# Patient Record
Sex: Female | Born: 1945 | Race: White | Hispanic: No | Marital: Married | Smoking: Never smoker
Health system: Southern US, Community
[De-identification: ages and names within clinical notes are randomized; demographics above are authoritative.]

## PROBLEM LIST (undated history)

## (undated) DIAGNOSIS — J45909 Unspecified asthma, uncomplicated: Secondary | ICD-10-CM

## (undated) DIAGNOSIS — K219 Gastro-esophageal reflux disease without esophagitis: Secondary | ICD-10-CM

---

## 2017-03-31 ENCOUNTER — Emergency Department (HOSPITAL_COMMUNITY): Payer: Medicare Other

## 2017-03-31 ENCOUNTER — Inpatient Hospital Stay (HOSPITAL_COMMUNITY): Payer: Medicare Other

## 2017-03-31 ENCOUNTER — Encounter (HOSPITAL_COMMUNITY): Payer: Self-pay

## 2017-03-31 ENCOUNTER — Inpatient Hospital Stay (HOSPITAL_COMMUNITY)
Admission: EM | Admit: 2017-03-31 | Discharge: 2017-04-13 | DRG: 330 | Disposition: A | Discharge status: Home / Self Care | Payer: Medicare Other | Attending: General Surgery | Admitting: General Surgery

## 2017-03-31 DIAGNOSIS — N390 Urinary tract infection, site not specified: Secondary | ICD-10-CM

## 2017-03-31 DIAGNOSIS — Z8249 Family history of ischemic heart disease and other diseases of the circulatory system: Secondary | ICD-10-CM | POA: Diagnosis not present

## 2017-03-31 DIAGNOSIS — R05 Cough: Secondary | ICD-10-CM

## 2017-03-31 DIAGNOSIS — H9190 Unspecified hearing loss, unspecified ear: Secondary | ICD-10-CM | POA: Diagnosis present

## 2017-03-31 DIAGNOSIS — Z9049 Acquired absence of other specified parts of digestive tract: Secondary | ICD-10-CM

## 2017-03-31 DIAGNOSIS — R402362 Coma scale, best motor response, obeys commands, at arrival to emergency department: Secondary | ICD-10-CM | POA: Diagnosis present

## 2017-03-31 DIAGNOSIS — K567 Ileus, unspecified: Secondary | ICD-10-CM | POA: Diagnosis not present

## 2017-03-31 DIAGNOSIS — R402142 Coma scale, eyes open, spontaneous, at arrival to emergency department: Secondary | ICD-10-CM | POA: Diagnosis present

## 2017-03-31 DIAGNOSIS — B961 Klebsiella pneumoniae [K. pneumoniae] as the cause of diseases classified elsewhere: Secondary | ICD-10-CM | POA: Diagnosis present

## 2017-03-31 DIAGNOSIS — R059 Cough, unspecified: Secondary | ICD-10-CM

## 2017-03-31 DIAGNOSIS — Z808 Family history of malignant neoplasm of other organs or systems: Secondary | ICD-10-CM | POA: Diagnosis not present

## 2017-03-31 DIAGNOSIS — D500 Iron deficiency anemia secondary to blood loss (chronic): Secondary | ICD-10-CM | POA: Diagnosis present

## 2017-03-31 DIAGNOSIS — D329 Benign neoplasm of meninges, unspecified: Secondary | ICD-10-CM | POA: Diagnosis present

## 2017-03-31 DIAGNOSIS — R911 Solitary pulmonary nodule: Secondary | ICD-10-CM | POA: Diagnosis present

## 2017-03-31 DIAGNOSIS — I517 Cardiomegaly: Secondary | ICD-10-CM | POA: Diagnosis present

## 2017-03-31 DIAGNOSIS — N183 Chronic kidney disease, stage 3 unspecified: Secondary | ICD-10-CM

## 2017-03-31 DIAGNOSIS — N179 Acute kidney failure, unspecified: Secondary | ICD-10-CM | POA: Diagnosis present

## 2017-03-31 DIAGNOSIS — Z886 Allergy status to analgesic agent status: Secondary | ICD-10-CM | POA: Diagnosis not present

## 2017-03-31 DIAGNOSIS — K7689 Other specified diseases of liver: Secondary | ICD-10-CM | POA: Diagnosis present

## 2017-03-31 DIAGNOSIS — E872 Acidosis, unspecified: Secondary | ICD-10-CM

## 2017-03-31 DIAGNOSIS — K644 Residual hemorrhoidal skin tags: Secondary | ICD-10-CM | POA: Diagnosis present

## 2017-03-31 DIAGNOSIS — R42 Dizziness and giddiness: Secondary | ICD-10-CM | POA: Diagnosis present

## 2017-03-31 DIAGNOSIS — K529 Noninfective gastroenteritis and colitis, unspecified: Secondary | ICD-10-CM | POA: Diagnosis present

## 2017-03-31 DIAGNOSIS — K219 Gastro-esophageal reflux disease without esophagitis: Secondary | ICD-10-CM

## 2017-03-31 DIAGNOSIS — C182 Malignant neoplasm of ascending colon: Principal | ICD-10-CM | POA: Diagnosis present

## 2017-03-31 DIAGNOSIS — R402252 Coma scale, best verbal response, oriented, at arrival to emergency department: Secondary | ICD-10-CM | POA: Diagnosis present

## 2017-03-31 DIAGNOSIS — D649 Anemia, unspecified: Secondary | ICD-10-CM | POA: Diagnosis present

## 2017-03-31 DIAGNOSIS — E042 Nontoxic multinodular goiter: Secondary | ICD-10-CM | POA: Diagnosis present

## 2017-03-31 DIAGNOSIS — E041 Nontoxic single thyroid nodule: Secondary | ICD-10-CM

## 2017-03-31 HISTORY — DX: Gastro-esophageal reflux disease without esophagitis: K21.9

## 2017-03-31 HISTORY — DX: Unspecified asthma, uncomplicated: J45.909

## 2017-03-31 LAB — CBC WITH DIFFERENTIAL/PLATELET
BASOS ABS: 0 10*3/uL (ref 0.0–0.1)
Basophils Relative: 0 %
EOS ABS: 0.2 10*3/uL (ref 0.0–0.7)
Eosinophils Relative: 2 %
HCT: 18.9 % — ABNORMAL LOW (ref 36.0–46.0)
Hemoglobin: 5.8 g/dL — CL (ref 12.0–15.0)
LYMPHS ABS: 1.1 10*3/uL (ref 0.7–4.0)
Lymphocytes Relative: 12 %
MCH: 22.4 pg — ABNORMAL LOW (ref 26.0–34.0)
MCHC: 30.7 g/dL (ref 30.0–36.0)
MCV: 73 fL — ABNORMAL LOW (ref 78.0–100.0)
MONO ABS: 0.4 10*3/uL (ref 0.1–1.0)
MONOS PCT: 4 %
NEUTROS ABS: 7.6 10*3/uL (ref 1.7–7.7)
Neutrophils Relative %: 82 %
PLATELETS: 398 10*3/uL (ref 150–400)
RBC: 2.59 MIL/uL — AB (ref 3.87–5.11)
RDW: 16.1 % — AB (ref 11.5–15.5)
WBC: 9.3 10*3/uL (ref 4.0–10.5)

## 2017-03-31 LAB — PROTIME-INR
INR: 1.04
PROTHROMBIN TIME: 13.5 s (ref 11.4–15.2)

## 2017-03-31 LAB — COMPREHENSIVE METABOLIC PANEL
ALBUMIN: 3.3 g/dL — AB (ref 3.5–5.0)
ALT: 9 U/L — ABNORMAL LOW (ref 14–54)
ANION GAP: 9 (ref 5–15)
AST: 13 U/L — AB (ref 15–41)
Alkaline Phosphatase: 73 U/L (ref 38–126)
BILIRUBIN TOTAL: 0.4 mg/dL (ref 0.3–1.2)
BUN: 31 mg/dL — AB (ref 6–20)
CHLORIDE: 110 mmol/L (ref 101–111)
CO2: 20 mmol/L — ABNORMAL LOW (ref 22–32)
Calcium: 8.9 mg/dL (ref 8.9–10.3)
Creatinine, Ser: 1.88 mg/dL — ABNORMAL HIGH (ref 0.44–1.00)
GFR calc Af Amer: 30 mL/min — ABNORMAL LOW (ref >60)
GFR calc non Af Amer: 26 mL/min — ABNORMAL LOW (ref >60)
GLUCOSE: 181 mg/dL — AB (ref 65–99)
POTASSIUM: 3.8 mmol/L (ref 3.5–5.1)
SODIUM: 139 mmol/L (ref 135–145)
TOTAL PROTEIN: 6.3 g/dL — AB (ref 6.5–8.1)

## 2017-03-31 LAB — URINALYSIS, ROUTINE W REFLEX MICROSCOPIC
BILIRUBIN URINE: NEGATIVE
Glucose, UA: NEGATIVE mg/dL
Ketones, ur: NEGATIVE mg/dL
NITRITE: NEGATIVE
PH: 7 (ref 5.0–8.0)
Protein, ur: NEGATIVE mg/dL
SPECIFIC GRAVITY, URINE: 1.004 — AB (ref 1.005–1.030)

## 2017-03-31 LAB — ABO/RH: ABO/RH(D): O POS

## 2017-03-31 LAB — SODIUM, URINE, RANDOM: SODIUM UR: 32 mmol/L

## 2017-03-31 LAB — MRSA PCR SCREENING: MRSA BY PCR: NEGATIVE

## 2017-03-31 LAB — LIPASE, BLOOD: LIPASE: 29 U/L (ref 11–51)

## 2017-03-31 LAB — PREPARE RBC (CROSSMATCH)

## 2017-03-31 LAB — POC OCCULT BLOOD, ED: Fecal Occult Bld: POSITIVE — AB

## 2017-03-31 MED ORDER — MECLIZINE HCL 25 MG PO TABS
50.0000 mg | ORAL_TABLET | Freq: Once | ORAL | Status: AC
  Administered 2017-03-31: 50 mg via ORAL
  Filled 2017-03-31: qty 2

## 2017-03-31 MED ORDER — DEXTROSE 5 % IV SOLN
1.0000 g | INTRAVENOUS | Status: AC
  Administered 2017-04-01 – 2017-04-05 (×6): 1 g via INTRAVENOUS
  Filled 2017-03-31 (×6): qty 10

## 2017-03-31 MED ORDER — SODIUM CHLORIDE 0.9 % IV SOLN
INTRAVENOUS | Status: DC
  Administered 2017-03-31: 23:00:00 via INTRAVENOUS

## 2017-03-31 MED ORDER — ACETAMINOPHEN 650 MG RE SUPP: 650.0000 mg | Freq: Four times a day (QID) | RECTAL | Status: DC | PRN

## 2017-03-31 MED ORDER — SODIUM CHLORIDE 0.9 % IV SOLN: Freq: Once | INTRAVENOUS | Status: DC

## 2017-03-31 MED ORDER — SODIUM CHLORIDE 0.9 % IV BOLUS (SEPSIS)
1000.0000 mL | Freq: Once | INTRAVENOUS | Status: AC
  Administered 2017-03-31: 1000 mL via INTRAVENOUS

## 2017-03-31 MED ORDER — ACETAMINOPHEN 325 MG PO TABS: 650.0000 mg | ORAL_TABLET | Freq: Four times a day (QID) | ORAL | Status: DC | PRN

## 2017-03-31 NOTE — ED Triage Notes (Signed)
Per EMS, pt from home.  Pt has been having diarrhea starting an hour ago.  Pt was on toilet and felt dizzy.  No LOC.  Hx of same.  Pt had an accident with stool and was taking a shower.  Dry heaving.  Slight abdominal pain.  Vitals: 138/76, hr 88, resp 18, cbg 114,

## 2017-03-31 NOTE — ED Notes (Signed)
Bed: FU07 Expected date:  Expected time:  Means of arrival:  Comments: EMS-vertigo

## 2017-03-31 NOTE — H&P (Addendum)
TRH H&P   Patient Demographics:    Rossi Silvestro, is a 71 y.o. female  MRN: [id number]   DOB - 10/02/1945  Admit Date - 03/31/2017  Outpatient Primary MD for the patient is [facility]  Referring MD/NP/PA: Gerald Stabs Tegeler   Outpatient Specialists:   Patient coming from: home  Chief Complaint  Patient presents with  . Dizziness      HPI:    Charline Hoskinson  is a 71 y.o. female, w anemia, Jerrye Bushy, c/o felt feeling fatigued. And then had n/v tonight, abdominal cramping and had diarrhea.  Pt denies hematemesis or coffee grounds.  Pt notes eating italian soup. Pt denies fever, chills, and is currently abdominal pain free.  Pt denies constipation, brbpr, black stool.  Pt denies any odd food eaten or recent travel or hiking.  + dizziness while on toilet.   Pt presented to ED for evaluation.   In ED.  Wbc 9.3, Hgb 5.8, Plt 398.  Bun/creatinine 31/1.88 Ast 13, Alt 9, Alk phos 73, T. Bili 0.4  ua Wbc 6-30.  CT brain => No acute intracranial abnormality. Specifically, no intracranial hemorrhage or CT evidence of large acute infarct.  Left frontal calvarium 11 mm calcified/ossified projection may represent focal area of hyperostosis frontalis interna or calcified meningioma. No surrounding vasogenic edema or significant mass effect.  Pt will be admitted for dizziness and n/v, abd pain , diarrhea and anemia.    Review of systems:    In addition to the HPI above, No recent antibiotic use Pt states prior colonoscopy negative, no diverticulosis, no polyps.  No hx of GI bleeding.   No Fever-chills, No Headache, No changes with Vision or hearing, No problems swallowing food or Liquids, No Chest pain, Cough or Shortness of Breath, No Blood in stool or Urine, No dysuria, No new skin rashes or bruises, No new joints pains-aches,  No new weakness, tingling, numbness in  any extremity, No recent weight gain or loss, No polyuria, polydypsia or polyphagia, No significant Mental Stressors.  A full 10 point Review of Systems was done, except as stated above, all other Review of Systems were negative.   With Past History of the following :    Past Medical History:  Diagnosis Date  . Asthma   . GERD (gastroesophageal reflux disease)       History reviewed. No pertinent surgical history.    Social History:     Social History  Substance Use Topics  . Smoking status: Never Smoker  . Smokeless tobacco: Never Used  . Alcohol use No     Lives - at home in [place], [place] for 2 years  Mobility - walks by self    Family History :     Family History  Problem Relation Age of Onset  . Brain cancer Mother   . Heart attack Father  Home Medications:   Prior to Admission medications   Not on File     Allergies:     Allergies  Allergen Reactions  . Aspirin      Physical Exam:   Vitals  Blood pressure 132/62, pulse 78, temperature (!) 97.4 F (36.3 C), temperature source Oral, resp. rate 17, SpO2 100 %.   1. General  lying in bed in NAD,   2. Normal affect and insight, Not Suicidal or Homicidal, Awake Alert, Oriented X 3.  3. No F.N deficits, ALL C.Nerves Intact, Strength 5/5 all 4 extremities, Sensation intact all 4 extremities, Plantars down going.  4. Ears and Eyes appear Normal,  PERRLA. Moist Oral Mucosa.  , Pale conjunctiva  5. Supple Neck, No JVD, No cervical lymphadenopathy appriciated, No Carotid Bruits.  6. Symmetrical Chest wall movement, Good air movement bilaterally, CTAB.  7. RRR, No Gallops, Rubs or Murmurs, No Parasternal Heave.  8. Positive Bowel Sounds, Abdomen Soft, No tenderness, No organomegaly appriciated,No rebound -guarding or rigidity.  9.  No Cyanosis, Normal Skin Turgor, No Skin Rash or Bruise.  10. Good muscle tone,  joints appear normal , no effusions, Normal ROM.  11. No Palpable Lymph  Nodes in Neck or Axillae    Data Review:    CBC  Recent Labs Lab 03/31/17 1707  WBC 9.3  HGB 5.8*  HCT 18.9*  PLT 398  MCV 73.0*  MCH 22.4*  MCHC 30.7  RDW 16.1*  LYMPHSABS 1.1  MONOABS 0.4  EOSABS 0.2  BASOSABS 0.0   ------------------------------------------------------------------------------------------------------------------  Chemistries   Recent Labs Lab 03/31/17 1707  NA 139  K 3.8  CL 110  CO2 20*  GLUCOSE 181*  BUN 31*  CREATININE 1.88*  CALCIUM 8.9  AST 13*  ALT 9*  ALKPHOS 73  BILITOT 0.4   ------------------------------------------------------------------------------------------------------------------ CrCl cannot be calculated (Unknown ideal weight.). ------------------------------------------------------------------------------------------------------------------ No results for input(s): TSH, T4TOTAL, T3FREE, THYROIDAB in the last 72 hours.  Invalid input(s): FREET3  Coagulation profile  Recent Labs Lab 03/31/17 1707  INR 1.04   ------------------------------------------------------------------------------------------------------------------- No results for input(s): DDIMER in the last 72 hours. -------------------------------------------------------------------------------------------------------------------  Cardiac Enzymes No results for input(s): CKMB, TROPONINI, MYOGLOBIN in the last 168 hours.  Invalid input(s): CK ------------------------------------------------------------------------------------------------------------------ No results found for: BNP   ---------------------------------------------------------------------------------------------------------------  Urinalysis    Component Value Date/Time   COLORURINE YELLOW 03/31/2017 1900   APPEARANCEUR CLEAR 03/31/2017 1900   LABSPEC 1.004 (L) 03/31/2017 1900   PHURINE 7.0 03/31/2017 1900   GLUCOSEU NEGATIVE 03/31/2017 1900   HGBUR SMALL (A) 03/31/2017 1900    BILIRUBINUR NEGATIVE 03/31/2017 1900   KETONESUR NEGATIVE 03/31/2017 1900   PROTEINUR NEGATIVE 03/31/2017 1900   NITRITE NEGATIVE 03/31/2017 1900   LEUKOCYTESUR MODERATE (A) 03/31/2017 1900    ----------------------------------------------------------------------------------------------------------------   Imaging Results:    Dg Chest 2 View  Result Date: 03/31/2017 CLINICAL DATA:  Acute onset of cough, chest congestion and diarrhea. EXAM: CHEST  2 VIEW COMPARISON:  None. FINDINGS: AP semi-erect and lateral images were obtained. Cardiac silhouette normal in size for AP technique. Thoracic aorta mildly tortuous. Hilar and mediastinal contours otherwise unremarkable. Lungs clear. Bronchovascular markings normal. Pulmonary vascularity normal. No visible pleural effusions. No pneumothorax. Degenerative changes involving the thoracic and upper lumbar spine. IMPRESSION: No acute cardiopulmonary disease. Electronically Signed   By: Evangeline Dakin M.D.   On: 03/31/2017 17:50   Ct Head Wo Contrast  Result Date: 03/31/2017 CLINICAL DATA:  71 year old female felt dizzy on toilet. Diarrhea. Initial encounter. EXAM:  CT HEAD WITHOUT CONTRAST TECHNIQUE: Contiguous axial images were obtained from the base of the skull through the vertex without intravenous contrast. COMPARISON:  None. FINDINGS: Brain: No intracranial hemorrhage or CT evidence of large acute infarct. Left frontal calvarium 11 mm calcified/ossified projection may represent focal area of hyperostosis frontalis interna or calcified meningioma. No surrounding vasogenic edema or significant mass effect. Vascular: Vascular calcifications Skull: No acute abnormality. Sinuses/Orbits: Visualized orbital structures unremarkable. Visualized paranasal sinuses clear. Other: Mastoid air cells and middle ear cavities clear. IMPRESSION: No acute intracranial abnormality. Specifically, no intracranial hemorrhage or CT evidence of large acute infarct. Left  frontal calvarium 11 mm calcified/ossified projection may represent focal area of hyperostosis frontalis interna or calcified meningioma. No surrounding vasogenic edema or significant mass effect. Electronically Signed   By: Genia Del M.D.   On: 03/31/2017 18:05     Assessment & Plan:    Principal Problem:   Anemia    Anemia Check ferritin, iron, tibc, folate, esr, spep, upep, tsh, celiac panel Check FOBT (ordered per ED) Check cbc in am Gi consult appreciated  N/v, diarrhea, abd pain Check lipase zofran Check CT scan abd pelvis Gi pathogen panel C. Diff  Dizziness Tele Trop I q6h x3 Check MRI brain Check carotid ultrasound Check cardiac echo Check EEG  UTI Await urine culture Start rocephin 1gm iv qday  ARF Check urine sodium, urine creatinine, urine eosinophils Hydrate with ns iv Check cmp in am    DVT Prophylaxis SCDs  AM Labs Ordered, also please review Full Orders  Family Communication: Admission, patients condition and plan of care including tests being ordered have been discussed with the patient  who indicate understanding and agree with the plan and Code Status.  Code Status FULL CODE  Likely DC to  home  Condition GUARDED    Consults called: GI by ED  Admission status: inpatient  Time spent in minutes : 45   Jani Gravel M.D on 03/31/2017 at 7:44 PM  Between 7am to 7pm - Pager - [phone] . After 7pm go to [email or web address] - password [email or web address]  [facility] - Office  [phone]

## 2017-03-31 NOTE — ED Provider Notes (Signed)
[facility] DEPT Provider Note   CSN: [id number] Arrival date & time: 03/31/17  1613     History   Chief Complaint Chief Complaint  Patient presents with  . Dizziness    HPI Jasmine Cuevas is a 71 y.o. female.  The history is provided by the patient, medical records and the spouse.  Dizziness  Quality:  Lightheadedness, vertigo and head spinning Severity:  Severe Onset quality:  Gradual Duration:  1 day Timing:  Constant Progression:  Waxing and waning Chronicity:  Recurrent Relieved by:  Nothing Worsened by:  Movement Ineffective treatments:  None tried Associated symptoms: diarrhea, headaches, nausea and vomiting   Associated symptoms: no chest pain, no palpitations, no shortness of breath, no syncope and no weakness   Associated symptoms comment:  Diarrrhea    No past medical history on file.  There are no active problems to display for this patient.   No past surgical history on file.  OB History    No data available       Home Medications    Prior to Admission medications   Not on File    Family History No family history on file.  Social History Social History  Substance Use Topics  . Smoking status: Not on file  . Smokeless tobacco: Not on file  . Alcohol use Not on file     Allergies   Patient has no allergy information on record.   Review of Systems Review of Systems  Constitutional: Positive for fatigue. Negative for chills, diaphoresis and fever.  HENT: Negative for congestion.   Eyes: Positive for photophobia. Negative for visual disturbance.  Respiratory: Negative for cough, chest tightness, shortness of breath, wheezing and stridor.   Cardiovascular: Negative for chest pain, palpitations and syncope.  Gastrointestinal: Positive for diarrhea, nausea and vomiting. Negative for abdominal pain and constipation.  Genitourinary: Negative for dysuria, flank pain and frequency.  Musculoskeletal:  Negative for back pain, neck pain and neck stiffness.  Skin: Negative for rash and wound.  Neurological: Positive for dizziness, light-headedness and headaches. Negative for seizures, weakness and numbness.  Psychiatric/Behavioral: Negative for agitation.  All other systems reviewed and are negative.    Physical Exam Updated Vital Signs BP (!) 116/47   Pulse 82   Temp 97.8 F (36.6 C) (Oral)   Resp 16   SpO2 98%   Physical Exam  Constitutional: She is oriented to person, place, and time. She appears well-developed and well-nourished. No distress.  HENT:  Head: Normocephalic and atraumatic.  Mouth/Throat: Oropharynx is clear and moist. No oropharyngeal exudate.  Eyes: Conjunctivae are normal. Right eye exhibits nystagmus. Left eye exhibits nystagmus.  Nystagmus to the left  Neck: Neck supple.  Cardiovascular: Normal rate and regular rhythm.  Exam reveals no gallop.   No murmur heard. Pulmonary/Chest: Effort normal and breath sounds normal. No respiratory distress. She has no wheezes. She has no rales. She exhibits no tenderness.  Abdominal: Soft. There is no tenderness.  Musculoskeletal: She exhibits no edema or tenderness.  Neurological: She is alert and oriented to person, place, and time. She is not disoriented. No cranial nerve deficit or sensory deficit. She exhibits normal muscle tone. Coordination normal. GCS eye subscore is 4. GCS verbal subscore is 5. GCS motor subscore is 6.  Patient's vertigo was reproducible with head and neck movement including sitting up.  Skin: Skin is warm and dry. No rash noted. She is not diaphoretic. No erythema.  Psychiatric: She has a normal mood  and affect.  Nursing note and vitals reviewed.    ED Treatments / Results  Labs (all labs ordered are listed, but only abnormal results are displayed) Labs Reviewed  CBC WITH DIFFERENTIAL/PLATELET - Abnormal; Notable for the following:       Result Value   RBC 2.59 (*)    Hemoglobin 5.8 (*)      HCT 18.9 (*)    MCV 73.0 (*)    MCH 22.4 (*)    RDW 16.1 (*)    All other components within normal limits  COMPREHENSIVE METABOLIC PANEL - Abnormal; Notable for the following:    CO2 20 (*)    Glucose, Bld 181 (*)    BUN 31 (*)    Creatinine, Ser 1.88 (*)    Total Protein 6.3 (*)    Albumin 3.3 (*)    AST 13 (*)    ALT 9 (*)    GFR calc non Af Amer 26 (*)    GFR calc Af Amer 30 (*)    All other components within normal limits  URINALYSIS, ROUTINE W REFLEX MICROSCOPIC - Abnormal; Notable for the following:    Specific Gravity, Urine 1.004 (*)    Hgb urine dipstick SMALL (*)    Leukocytes, UA MODERATE (*)    Bacteria, UA MANY (*)    Squamous Epithelial / LPF 0-5 (*)    All other components within normal limits  POC OCCULT BLOOD, ED - Abnormal; Notable for the following:    Fecal Occult Bld POSITIVE (*)    All other components within normal limits  MRSA PCR SCREENING  URINE CULTURE  GASTROINTESTINAL PANEL BY PCR, STOOL (REPLACES STOOL CULTURE)  C DIFFICILE QUICK SCREEN W PCR REFLEX  PROTIME-INR  SODIUM, URINE, RANDOM  LIPASE, BLOOD  FERRITIN  IRON AND TIBC  VITAMIN B12  FOLATE RBC  TSH  GLIADIN ANTIBODIES, SERUM  TISSUE TRANSGLUTAMINASE, IGA  RETICULIN ANTIBODIES, IGA W TITER  PROTEIN ELECTROPHORESIS, SERUM  IFE/LIGHT CHAINS/TP QN, 24-HR UR  CALCIUM / CREATININE RATIO, URINE  COMPREHENSIVE METABOLIC PANEL  CBC  TROPONIN I  TROPONIN I  TROPONIN I  TYPE AND SCREEN  PREPARE RBC (CROSSMATCH)  ABO/RH  CYTOLOGY - NON PAP    EKG  EKG Interpretation  Date/Time:  Tuesday March 31 2017 17:05:12 EDT Ventricular Rate:  58 PR Interval:    QRS Duration: 170 QT Interval:  513 QTC Calculation: 504 R Axis:   -12 Text Interpretation:  Sinus rhythm Consider left atrial enlargement Right bundle branch block No prior ECG for comparison.  No STEMI Confirmed by Antony Blackbird [phone]) on 03/31/2017 6:49:19 PM       Radiology Dg Chest 2 View  Result Date:  03/31/2017 CLINICAL DATA:  Acute onset of cough, chest congestion and diarrhea. EXAM: CHEST  2 VIEW COMPARISON:  None. FINDINGS: AP semi-erect and lateral images were obtained. Cardiac silhouette normal in size for AP technique. Thoracic aorta mildly tortuous. Hilar and mediastinal contours otherwise unremarkable. Lungs clear. Bronchovascular markings normal. Pulmonary vascularity normal. No visible pleural effusions. No pneumothorax. Degenerative changes involving the thoracic and upper lumbar spine. IMPRESSION: No acute cardiopulmonary disease. Electronically Signed   By: Evangeline Dakin M.D.   On: 03/31/2017 17:50   Ct Head Wo Contrast  Result Date: 03/31/2017 CLINICAL DATA:  71 year old female felt dizzy on toilet. Diarrhea. Initial encounter. EXAM: CT HEAD WITHOUT CONTRAST TECHNIQUE: Contiguous axial images were obtained from the base of the skull through the vertex without intravenous contrast. COMPARISON:  None. FINDINGS:  Brain: No intracranial hemorrhage or CT evidence of large acute infarct. Left frontal calvarium 11 mm calcified/ossified projection may represent focal area of hyperostosis frontalis interna or calcified meningioma. No surrounding vasogenic edema or significant mass effect. Vascular: Vascular calcifications Skull: No acute abnormality. Sinuses/Orbits: Visualized orbital structures unremarkable. Visualized paranasal sinuses clear. Other: Mastoid air cells and middle ear cavities clear. IMPRESSION: No acute intracranial abnormality. Specifically, no intracranial hemorrhage or CT evidence of large acute infarct. Left frontal calvarium 11 mm calcified/ossified projection may represent focal area of hyperostosis frontalis interna or calcified meningioma. No surrounding vasogenic edema or significant mass effect. Electronically Signed   By: Genia Del M.D.   On: 03/31/2017 18:05    Procedures Procedures (including critical care time)  CRITICAL CARE Performed by: Gwenyth Allegra  Tegeler Total critical care time:  35 minutes Symptomatic anemia requiring transfusion. Critical care time was exclusive of separately billable procedures and treating other patients. Critical care was necessary to treat or prevent imminent or life-threatening deterioration. Critical care was time spent personally by me on the following activities: development of treatment plan with patient and/or surrogate as well as nursing, discussions with consultants, evaluation of patient's response to treatment, examination of patient, obtaining history from patient or surrogate, ordering and performing treatments and interventions, ordering and review of laboratory studies, ordering and review of radiographic studies, pulse oximetry and re-evaluation of patient's condition.   Medications Ordered in ED Medications - No data to display   Initial Impression / Assessment and Plan / ED Course  I have reviewed the triage vital signs and the nursing notes.  Pertinent labs & imaging results that were available during my care of the patient were reviewed by me and considered in my medical decision making (see chart for details).     Jasmine Cuevas is a 71 y.o. female with a past medical history of vertigo who presents with her husband and friend for fatigue, nausea, vomiting, diarrhea, chills, congestion, cough, autophobia, and dizziness.  Patient reports that she began feeling bad yesterday.  She said that she was fatigued had chills and had some nausea.  Her husband reports that she is been having some chest congestion and heaviness.  She denied chest pain.  She says that she was feeling slightly better this morning but went to make soup and then started having nausea.  She went to the bathroom and had multiple episodes of diarrhea and emesis followed by onset of dizzy sensation.  She feels that her head was spinning and any body movement or head movement made it worse.  She says it feels the same as her prior  vertigo.  She denies vision changes or double vision.  She denies severe headache.  She reports that she was unable to get up out of the bathtub so her husband called EMS.  She currently denies any headache, abdominal pain, or chest pain.  She denies traumatic injuries.  She denies neck pain or neck stiffness.  On exam, patient reports she is still feeling dizzy.  It is worsened with movement and is reproducible with body movement.  Patient found to have nystagmus when looking to the left.  Patient has normal sensation in the face arms and legs.  Normal coordination finger-nose-finger.  Normal pupil reaction.  No facial droop.  Normal speech.  Lungs clear and chest nontender.  Patient is alert and oriented.  Suspect patient has dehydration from her nausea vomiting and diarrhea that potentially precipitated a vertigo spell.  Given the positional  worsening of symptoms, doubt stroke at this time.  Patient will have laboratory testing and imaging to look for occult infection.  Patient will have a head CT however.  Patient will be given fluids and meclizine to try to alleviate her symptoms.  Anticipate reassessment after testing.  Diagnostic testing results see above.  Hemoglobin found to be low at 5.8.  Fecal occult test was also positive.  Suspect GI bleed.  Type and screen was sent and patient was given 2 units of blood.  No nitrites seen but there were leukocytes and bacteria with hemoglobin.  Uncertain if patient has UTI given lack of symptoms.  Culture will be sent.  Patient also found to have acute kidney injury.  Patient felt better in regards to  Dizziness and nausea after fluids and meclizine.  However suspect symptomatic anemia contributing to symptoms.  CT of the head showed possible meningioma but otherwise no intracranial intracranial abnormality such as hemorrhage or large infarct.  Patient may need MRI to further characterize.  Chest x-ray shows no pneumonia.  Due to anemia and concern for GI  bleed, gastroenterology was called and will see patient tomorrow.  Patient admitted to hospitalist service for further management.  Patient admitted in stable condition.    Final Clinical Impressions(s) / ED Diagnoses   Final diagnoses:  Vertigo  Symptomatic anemia     Clinical Impression: 1. Vertigo   2. Symptomatic anemia     Disposition: Admit to [facility] service    Tegeler, Gwenyth Allegra, MD 04/01/17 [phone]

## 2017-04-01 ENCOUNTER — Inpatient Hospital Stay (HOSPITAL_COMMUNITY): Payer: Medicare Other

## 2017-04-01 DIAGNOSIS — N390 Urinary tract infection, site not specified: Secondary | ICD-10-CM

## 2017-04-01 DIAGNOSIS — N179 Acute kidney failure, unspecified: Secondary | ICD-10-CM

## 2017-04-01 DIAGNOSIS — R42 Dizziness and giddiness: Secondary | ICD-10-CM

## 2017-04-01 LAB — TYPE AND SCREEN
ABO/RH(D): O POS
ANTIBODY SCREEN: NEGATIVE
Unit division: 0
Unit division: 0

## 2017-04-01 LAB — COMPREHENSIVE METABOLIC PANEL
ALT: 11 U/L — AB (ref 14–54)
AST: 15 U/L (ref 15–41)
Albumin: 3.2 g/dL — ABNORMAL LOW (ref 3.5–5.0)
Alkaline Phosphatase: 70 U/L (ref 38–126)
Anion gap: 8 (ref 5–15)
BUN: 23 mg/dL — ABNORMAL HIGH (ref 6–20)
CHLORIDE: 120 mmol/L — AB (ref 101–111)
CO2: 20 mmol/L — ABNORMAL LOW (ref 22–32)
CREATININE: 1.77 mg/dL — AB (ref 0.44–1.00)
Calcium: 9.3 mg/dL (ref 8.9–10.3)
GFR, EST AFRICAN AMERICAN: 32 mL/min — AB (ref >60)
GFR, EST NON AFRICAN AMERICAN: 28 mL/min — AB (ref >60)
Glucose, Bld: 99 mg/dL (ref 65–99)
POTASSIUM: 3.9 mmol/L (ref 3.5–5.1)
Sodium: 148 mmol/L — ABNORMAL HIGH (ref 135–145)
TOTAL PROTEIN: 6.4 g/dL — AB (ref 6.5–8.1)
Total Bilirubin: 0.4 mg/dL (ref 0.3–1.2)

## 2017-04-01 LAB — VAS US CAROTID
LCCADDIAS: 17 cm/s
LEFT ECA DIAS: -14 cm/s
LEFT VERTEBRAL DIAS: -18 cm/s
LICADDIAS: -27 cm/s
LICADSYS: -92 cm/s
LICAPSYS: -61 cm/s
Left CCA dist sys: 78 cm/s
Left CCA prox dias: 9 cm/s
Left CCA prox sys: 90 cm/s
Left ICA prox dias: -18 cm/s
RIGHT ECA DIAS: -8 cm/s
RIGHT VERTEBRAL DIAS: -13 cm/s
Right CCA prox dias: 17 cm/s
Right CCA prox sys: 108 cm/s
Right cca dist sys: -84 cm/s

## 2017-04-01 LAB — IRON AND TIBC
Iron: 33 ug/dL (ref 28–170)
SATURATION RATIOS: 9 % — AB (ref 10.4–31.8)
TIBC: 375 ug/dL (ref 250–450)
UIBC: 342 ug/dL

## 2017-04-01 LAB — VITAMIN B12: Vitamin B-12: 665 pg/mL (ref 180–914)

## 2017-04-01 LAB — C DIFFICILE QUICK SCREEN W PCR REFLEX
C DIFFICLE (CDIFF) ANTIGEN: NEGATIVE
C Diff interpretation: NOT DETECTED
C Diff toxin: NEGATIVE

## 2017-04-01 LAB — BPAM RBC
BLOOD PRODUCT EXPIRATION DATE: 201811222359
Blood Product Expiration Date: 201811222359
ISSUE DATE / TIME: 201810232001
ISSUE DATE / TIME: 201810232343
UNIT TYPE AND RH: 5100
UNIT TYPE AND RH: 5100

## 2017-04-01 LAB — ECHOCARDIOGRAM COMPLETE: WEIGHTICAEL: 3121.71 [oz_av]

## 2017-04-01 LAB — FERRITIN: FERRITIN: 9 ng/mL — AB (ref 11–307)

## 2017-04-01 LAB — TROPONIN I: Troponin I: <0.03 ng/mL (ref <0.03)

## 2017-04-01 LAB — TSH: TSH: 1.213 u[IU]/mL (ref 0.350–4.500)

## 2017-04-01 MED ORDER — LACTATED RINGERS IV SOLN
INTRAVENOUS | Status: AC
  Administered 2017-04-01 – 2017-04-02 (×3): via INTRAVENOUS

## 2017-04-01 MED ORDER — FERROUS SULFATE 325 (65 FE) MG PO TABS
325.0000 mg | ORAL_TABLET | Freq: Two times a day (BID) | ORAL | Status: DC
  Administered 2017-04-01 – 2017-04-05 (×8): 325 mg via ORAL
  Filled 2017-04-01 (×8): qty 1

## 2017-04-01 MED ORDER — PEG-KCL-NACL-NASULF-NA ASC-C 100 G PO SOLR
0.5000 | Freq: Once | ORAL | Status: AC
  Administered 2017-04-02: 100 g via ORAL

## 2017-04-01 MED ORDER — CHLORHEXIDINE GLUCONATE 0.12 % MT SOLN
15.0000 mL | Freq: Two times a day (BID) | OROMUCOSAL | Status: DC
  Administered 2017-04-01 – 2017-04-06 (×9): 15 mL via OROMUCOSAL
  Filled 2017-04-01 (×9): qty 15

## 2017-04-01 MED ORDER — ORAL CARE MOUTH RINSE
15.0000 mL | Freq: Two times a day (BID) | OROMUCOSAL | Status: DC
  Administered 2017-04-01 – 2017-04-04 (×4): 15 mL via OROMUCOSAL

## 2017-04-01 MED ORDER — IOPAMIDOL (ISOVUE-300) INJECTION 61%: 15.0000 mL | Freq: Once | INTRAVENOUS | Status: AC | PRN

## 2017-04-01 MED ORDER — IOPAMIDOL (ISOVUE-300) INJECTION 61%
INTRAVENOUS | Status: AC
  Filled 2017-04-01: qty 30

## 2017-04-01 MED ORDER — PEG-KCL-NACL-NASULF-NA ASC-C 100 G PO SOLR
0.5000 | Freq: Once | ORAL | Status: AC
  Administered 2017-04-01: 100 g via ORAL
  Filled 2017-04-01: qty 1

## 2017-04-01 MED ORDER — PEG-KCL-NACL-NASULF-NA ASC-C 100 G PO SOLR: 1.0000 | Freq: Once | ORAL | Status: DC

## 2017-04-01 NOTE — Progress Notes (Signed)
*  PRELIMINARY RESULTS* Vascular Ultrasound Carotid Duplex (Doppler) has been completed.  Preliminary findings: Bilateral 1-39% ICA stenosis, antegrade vertebral flow.  Thyroid nodules noted bilaterally.   Everrett Coombe 04/01/2017, 11:51 AM

## 2017-04-01 NOTE — Progress Notes (Signed)
PROGRESS NOTE    Jasmine Cuevas  LHT:[id number] DOB: [birth year] DOA: 03/31/2017 PCP: System, Pcp Not In (Confirm with patient/family/NH records and if not entered, this HAS to be entered at [facility] point of entry. "No PCP" if truly none.)   Brief Narrative: (Start on day 1 of progress note - keep it brief and live) 71 yo F with hx of anemia, GERD presenting with fatigue, nausea, vomiting, vertigo found to have anemia and positive stool hemoccult.  Admitted for anemia, dizziness, N/V.     Assessment & Plan:   Principal Problem:   Anemia Active Problems:   Dizziness   Anemia: microcytic.  5.8 on presentation, improved to 8.7 after transfusion.  Positive hemoccult in ED.   Labs pending including iron panel, ferritin, folate, ESR, SPEP, upep, TSH, celiac panel GI consult pending, plan for endoscopy/colonoscopy tomorrow.  Nausea, vomiting, abdominal pain:  Suspect gastroenteritis as sx have resolved.  CT abdomen pelvis pending.     GI path panel and c diff pending.   Vertigo: notes hx of vertigo in past, sx improved with meclizine.  She has hearing loss, but this is chronic, maybe worse over last week, though vertigo coincided with nausea and vomiting.   Will have work with PT/OT.    MRI notable for meningioma. Echo, carotid dopplers pending. Troponin negative.  EEG pending.  UTI F/u urine cx.  Ceftriaxone.  AKI - unclear baseline, 1.88, improved today to 1.77. Urine sodium, creatinine, urine eos pending Continue IVF  No PCP, placed care management consult for PCP  DVT prophylaxis: SCD Code Status: Full  Family Communication: husband in room Disposition Plan: pending   Consultants:   GI, Dr. Paulita Fujita  Procedures: (Don't include imaging studies which can be auto populated. Include things that cannot be auto populated i.e. Echo, Carotid and venous dopplers, Foley, Bipap, HD, tubes/drains, wound vac, central lines etc)  Echo, carotid dopplers, eeg pending  Antimicrobials:  (specify start and planned stop date. Auto populated tables are space occupying and do not give end dates)  ceftriaxone 10/24 -    Subjective: Sx are better.  Pretty much resolved.  No abdominal pain, nausea, vomiting or diarrhea.  Vertigo improved with meclizine. 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    Objective: Vitals:   04/01/17 0400 04/01/17 0500 04/01/17 0600 04/01/17 0741  BP: (!) 157/67 (!) 142/62 (!) 132/46   Pulse: 72 73 76   Resp: _0 Temp:    97.9 F (36.6 C)  TempSrc:    Oral  SpO2: 97% 94%    Weight:  88.5 kg (195 lb 1.7 oz)      Intake/Output Summary (Last 24 hours) at 04/01/17 0817 Last data filed at 04/01/17 0600  Gross per 24 hour  Intake             2960 ml  Output              775 ml  Net             2185 ml   Filed Weights   04/01/17 0500  Weight: 88.5 kg (195 lb 1.7 oz)    Examination:  General exam: Appears calm and comfortable  Respiratory system: Clear to auscultation. Respiratory effort normal. Cardiovascular system: S1 & S2 heard, RRR. No JVD, murmurs, rubs, gallops or clicks. No pedal edema. Gastrointestinal system: Abdomen is nondistended, soft and nontender. No organomegaly or masses felt. Normal bowel sounds heard. Central nervous system: Alert and oriented. No focal  neurological deficits. Extremities: Symmetric 5 x 5 power. Skin: No rashes, lesions or ulcers Psychiatry: Judgement and insight appear normal. Mood & affect appropriate.     Data Reviewed: I have personally reviewed following labs and imaging studies  CBC:  Recent Labs Lab 03/31/17 1707 04/01/17 0413  WBC 9.3 7.2  NEUTROABS 7.6  --   HGB 5.8* 8.7*  HCT 18.9* 27.0*  MCV 73.0* 78.3  PLT 398 253   Basic Metabolic Panel:  Recent Labs Lab 03/31/17 1707  NA 139  K 3.8  CL 110  CO2 20*  GLUCOSE 181*  BUN 31*  CREATININE 1.88*  CALCIUM 8.9   GFR: CrCl cannot be calculated (Unknown ideal weight.). Liver Function Tests:  Recent Labs Lab  03/31/17 1707  AST 13*  ALT 9*  ALKPHOS 73  BILITOT 0.4  PROT 6.3*  ALBUMIN 3.3*    Recent Labs Lab 03/31/17 2030  LIPASE 29   No results for input(s): AMMONIA in the last 168 hours. Coagulation Profile:  Recent Labs Lab 03/31/17 1707  INR 1.04   Cardiac Enzymes:  Recent Labs Lab 04/01/17 0413  TROPONINI <0.03   BNP (last 3 results) No results for input(s): PROBNP in the last 8760 hours. HbA1C: No results for input(s): HGBA1C in the last 72 hours. CBG: No results for input(s): GLUCAP in the last 168 hours. Lipid Profile: No results for input(s): CHOL, HDL, LDLCALC, TRIG, CHOLHDL, LDLDIRECT in the last 72 hours. Thyroid Function Tests:  Recent Labs  04/01/17 0413  TSH 1.213   Anemia Panel: No results for input(s): VITAMINB12, FOLATE, FERRITIN, TIBC, IRON, RETICCTPCT in the last 72 hours. Sepsis Labs: No results for input(s): PROCALCITON, LATICACIDVEN in the last 168 hours.  Recent Results (from the past 240 hour(s))  MRSA PCR Screening     Status: None   Collection Time: 03/31/17 10:14 PM  Result Value Ref Range Status   MRSA by PCR NEGATIVE NEGATIVE Final    Comment:        The GeneXpert MRSA Assay (FDA approved for NASAL specimens only), is one component of Carmine Carrozza comprehensive MRSA colonization surveillance program. It is not intended to diagnose MRSA infection nor to guide or monitor treatment for MRSA infections.          Radiology Studies: Dg Chest 2 View  Result Date: 03/31/2017 CLINICAL DATA:  Acute onset of cough, chest congestion and diarrhea. EXAM: CHEST  2 VIEW COMPARISON:  None. FINDINGS: AP semi-erect and lateral images were obtained. Cardiac silhouette normal in size for AP technique. Thoracic aorta mildly tortuous. Hilar and mediastinal contours otherwise unremarkable. Lungs clear. Bronchovascular markings normal. Pulmonary vascularity normal. No visible pleural effusions. No pneumothorax. Degenerative changes involving the  thoracic and upper lumbar spine. IMPRESSION: No acute cardiopulmonary disease. Electronically Signed   By: Evangeline Dakin M.D.   On: 03/31/2017 17:50   Ct Head Wo Contrast  Result Date: 03/31/2017 CLINICAL DATA:  71 year old female felt dizzy on toilet. Diarrhea. Initial encounter. EXAM: CT HEAD WITHOUT CONTRAST TECHNIQUE: Contiguous axial images were obtained from the base of the skull through the vertex without intravenous contrast. COMPARISON:  None. FINDINGS: Brain: No intracranial hemorrhage or CT evidence of large acute infarct. Left frontal calvarium 11 mm calcified/ossified projection may represent focal area of hyperostosis frontalis interna or calcified meningioma. No surrounding vasogenic edema or significant mass effect. Vascular: Vascular calcifications Skull: No acute abnormality. Sinuses/Orbits: Visualized orbital structures unremarkable. Visualized paranasal sinuses clear. Other: Mastoid air cells and middle ear cavities clear. IMPRESSION:  No acute intracranial abnormality. Specifically, no intracranial hemorrhage or CT evidence of large acute infarct. Left frontal calvarium 11 mm calcified/ossified projection may represent focal area of hyperostosis frontalis interna or calcified meningioma. No surrounding vasogenic edema or significant mass effect. Electronically Signed   By: Genia Del M.D.   On: 03/31/2017 18:05   Mr Brain Wo Contrast  Result Date: 04/01/2017 CLINICAL DATA:  Vertigo.  Nausea and vomiting. EXAM: MRI HEAD WITHOUT CONTRAST TECHNIQUE: Multiplanar, multiecho pulse sequences of the brain and surrounding structures were obtained without intravenous contrast. COMPARISON:  03/31/2017 head CT FINDINGS: Brain: There is no evidence of acute infarct, intracranial hemorrhage, midline shift, or extra-axial fluid collection. Mild generalized cerebral atrophy is within normal limits for age. No significant cerebral white matter disease is seen. 1 cm extra-axial focus of T2  hypointensity and susceptibility along the inner table of the left frontal skull corresponds to the dense calcification/ossification on CT and may represent focal hyperostosis or meningioma. There is no associated mass effect or brain edema. Vascular: Major intracranial vascular flow voids are preserved. Skull and upper cervical spine: Unremarkable bone marrow signal. Sinuses/Orbits: Unremarkable orbits. Minimal right maxillary sinus mucosal thickening. Clear mastoid air cells. Other: None. IMPRESSION: 1. No acute intracranial abnormality. 2. 1 cm focal ossification or densely calcified meningioma along the left frontal skull. No mass effect or edema. Electronically Signed   By: Logan Bores M.D.   On: 04/01/2017 07:17        Scheduled Meds: . chlorhexidine  15 mL Mouth Rinse BID  . iopamidol      . mouth rinse  15 mL Mouth Rinse q12n4p   Continuous Infusions: . sodium chloride    . sodium chloride 100 mL/hr at 04/01/17 0600  . cefTRIAXone (ROCEPHIN)  IV Stopped (04/01/17 0428)     LOS: 1 day    Time spent: over 30 minutes    Fayrene Helper, MD [facility] Pager [phone]  If 7PM-7AM, please contact night-coverage [email or web address] Password [email or web address] 04/01/2017, 8:17 AM

## 2017-04-01 NOTE — Care Management Note (Signed)
Case Management Note  Patient Details  Name: Jasmine Cuevas MRN: [id number] Date of Birth: [birth year]  Subjective/Objective:                  Anemia and I unit of prbc  Action/Plan: Date:  April 01 2017 Chart reviewed for concurrent status and case management needs.  Will continue to follow patient progress.  Discharge Planning: following for needs  Expected discharge date: April 04, 2017  Velva Harman, BSN, [place], [place]   Expected Discharge Date:   (unknown)               Expected Discharge Plan:  Home/Self Care  In-House Referral:     Discharge planning Services  CM Consult  Post Acute Care Choice:    Choice offered to:     DME Arranged:    DME Agency:     HH Arranged:    HH Agency:     Status of Service:  In process, will continue to follow  If discussed at Long Length of Stay Meetings, dates discussed:    Additional Comments:  Leeroy Cha, RN 04/01/2017, 9:38 AM

## 2017-04-01 NOTE — Consult Note (Signed)
[facility] Consultation Note  Referring Provider: Dr. Jani Gravel Primary Care Physician:  System, Pcp Not In  Reason for Consultation:  anemia  HPI: Jasmine Cuevas is a 71 y.o. female admitted for one-day history of weakness and pre-syncope; found to have Hgb <6.  She has chronic GERD.  No dysphagia, nausea, vomiting, abdominal pain, change in bowel habits, blood in stool (but hemoccult-positive), loss-of-appetite, unintentional weight loss.  No prior egd.  Colonoscopy about 7 years ago in [place], unclear results.   Past Medical History:  Diagnosis Date  . Asthma   . GERD (gastroesophageal reflux disease)     History reviewed. No pertinent surgical history.  Prior to Admission medications   Not on File    Current Facility-Administered Medications  Medication Dose Route Frequency Provider Last Rate Last Dose  . 0.9 %  sodium chloride infusion   Intravenous Once Tegeler, Gwenyth Allegra, MD      . 0.9 %  sodium chloride infusion   Intravenous Continuous Jani Gravel, MD 100 mL/hr at 04/01/17 0600    . acetaminophen (TYLENOL) tablet 650 mg  650 mg Oral Q6H PRN Jani Gravel, MD       Or  . acetaminophen (TYLENOL) suppository 650 mg  650 mg Rectal Q6H PRN Jani Gravel, MD      . cefTRIAXone (ROCEPHIN) 1 g in dextrose 5 % 50 mL IVPB  1 g Intravenous Q24H Jani Gravel, MD   Stopped at 04/01/17 [phone]  . chlorhexidine (PERIDEX) 0.12 % solution 15 mL  15 mL Mouth Rinse BID Jani Gravel, MD      . iopamidol (ISOVUE-300) 61 % injection 15 mL  15 mL Oral Once PRN Elodia Florence., MD      . iopamidol (ISOVUE-300) 61 % injection           . MEDLINE mouth rinse  15 mL Mouth Rinse q12n4p Jani Gravel, MD        Allergies as of 03/31/2017 - Review Complete 03/31/2017  Allergen Reaction Noted  . Aspirin  03/31/2017    Family History  Problem Relation Age of Onset  . Brain cancer Mother   . Heart attack Father     Social History   Social History  . Marital status: Married    Spouse  name: N/A  . Number of children: N/A  . Years of education: N/A   Occupational History  . Not on file.   Social History Main Topics  . Smoking status: Never Smoker  . Smokeless tobacco: Never Used  . Alcohol use No  . Drug use: No  . Sexual activity: Not on file   Other Topics Concern  . Not on file   Social History Narrative  . No narrative on file    Review of Systems: As per HPI, all others negative  Physical Exam: Vital signs in last 24 hours: Temp:  [97.4 F (36.3 C)-98.5 F (36.9 C)] 97.9 F (36.6 C) (10/24 0741) Pulse Rate:  [62-87] 76 (10/24 0600) Resp:  [9-21] 16 (10/24 0600) BP: (109-157)/(45-96) 132/46 (10/24 0600) SpO2:  [83 %-100 %] 94 % (10/24 0500) Weight:  [195 lb 1.7 oz (88.5 kg)] 195 lb 1.7 oz (88.5 kg) (10/24 0500) Last BM Date: 03/31/17 General:   Alert, overweight, pale, Well-developed, well-nourished, pleasant and cooperative in NAD Head:  Normocephalic and atraumatic. Eyes:  Sclera clear, no icterus.   Conjunctiva pale Ears:  Normal auditory acuity. Nose:  No deformity, discharge,  or lesions. Mouth:  No deformity  or lesions.  Oropharynx pale and dry Neck:  Supple; no masses or thyromegaly. Lungs:  Clear throughout to auscultation.   No wheezes, crackles, or rhonchi. No acute distress. Heart:  Regular rate and rhythm; no murmurs, clicks, rubs,  or gallops. Abdomen:  Soft, protuberant, nontender and nondistended. No masses, hepatosplenomegaly or hernias noted. Normal bowel sounds, without guarding, and without rebound.     Msk:  Symmetrical without gross deformities. Normal posture. Pulses:  Normal pulses noted. Extremities:  Without clubbing or edema. Neurologic:  Alert and  oriented x4; diffusely weak, otherwise grossly normal neurologically. Skin:  Occasional ecchymoses, otherwise intact without significant lesions or rashes. Psych:  Alert and cooperative. Normal mood and affect.   Lab Results:  Recent Labs  03/31/17 1707  04/01/17 0413  WBC 9.3 7.2  HGB 5.8* 8.7*  HCT 18.9* 27.0*  PLT 398 369   BMET  Recent Labs  03/31/17 1707  NA 139  K 3.8  CL 110  CO2 20*  GLUCOSE 181*  BUN 31*  CREATININE 1.88*  CALCIUM 8.9   LFT  Recent Labs  03/31/17 1707  PROT 6.3*  ALBUMIN 3.3*  AST 13*  ALT 9*  ALKPHOS 73  BILITOT 0.4   PT/INR  Recent Labs  03/31/17 1707  LABPROT 13.5  INR 1.04    Studies/Results: Dg Chest 2 View  Result Date: 03/31/2017 CLINICAL DATA:  Acute onset of cough, chest congestion and diarrhea. EXAM: CHEST  2 VIEW COMPARISON:  None. FINDINGS: AP semi-erect and lateral images were obtained. Cardiac silhouette normal in size for AP technique. Thoracic aorta mildly tortuous. Hilar and mediastinal contours otherwise unremarkable. Lungs clear. Bronchovascular markings normal. Pulmonary vascularity normal. No visible pleural effusions. No pneumothorax. Degenerative changes involving the thoracic and upper lumbar spine. IMPRESSION: No acute cardiopulmonary disease. Electronically Signed   By: Evangeline Dakin M.D.   On: 03/31/2017 17:50   Ct Head Wo Contrast  Result Date: 03/31/2017 CLINICAL DATA:  71 year old female felt dizzy on toilet. Diarrhea. Initial encounter. EXAM: CT HEAD WITHOUT CONTRAST TECHNIQUE: Contiguous axial images were obtained from the base of the skull through the vertex without intravenous contrast. COMPARISON:  None. FINDINGS: Brain: No intracranial hemorrhage or CT evidence of large acute infarct. Left frontal calvarium 11 mm calcified/ossified projection may represent focal area of hyperostosis frontalis interna or calcified meningioma. No surrounding vasogenic edema or significant mass effect. Vascular: Vascular calcifications Skull: No acute abnormality. Sinuses/Orbits: Visualized orbital structures unremarkable. Visualized paranasal sinuses clear. Other: Mastoid air cells and middle ear cavities clear. IMPRESSION: No acute intracranial abnormality.  Specifically, no intracranial hemorrhage or CT evidence of large acute infarct. Left frontal calvarium 11 mm calcified/ossified projection may represent focal area of hyperostosis frontalis interna or calcified meningioma. No surrounding vasogenic edema or significant mass effect. Electronically Signed   By: Genia Del M.D.   On: 03/31/2017 18:05   Mr Brain Wo Contrast  Result Date: 04/01/2017 CLINICAL DATA:  Vertigo.  Nausea and vomiting. EXAM: MRI HEAD WITHOUT CONTRAST TECHNIQUE: Multiplanar, multiecho pulse sequences of the brain and surrounding structures were obtained without intravenous contrast. COMPARISON:  03/31/2017 head CT FINDINGS: Brain: There is no evidence of acute infarct, intracranial hemorrhage, midline shift, or extra-axial fluid collection. Mild generalized cerebral atrophy is within normal limits for age. No significant cerebral white matter disease is seen. 1 cm extra-axial focus of T2 hypointensity and susceptibility along the inner table of the left frontal skull corresponds to the dense calcification/ossification on CT and may represent focal hyperostosis  or meningioma. There is no associated mass effect or brain edema. Vascular: Major intracranial vascular flow voids are preserved. Skull and upper cervical spine: Unremarkable bone marrow signal. Sinuses/Orbits: Unremarkable orbits. Minimal right maxillary sinus mucosal thickening. Clear mastoid air cells. Other: None. IMPRESSION: 1. No acute intracranial abnormality. 2. 1 cm focal ossification or densely calcified meningioma along the left frontal skull. No mass effect or edema. Electronically Signed   By: Logan Bores M.D.   On: 04/01/2017 07:17   Impression:  1.  Anemia, symptomatic. 2.  Hemoccult-positive stool. 3.  GERD.  Plan:  1.  Endoscopy and colonoscopy tomorrow for further evaluation. 2.  PPI. 3.  IV fluids. 4.  Serial CBCs. 5.  Next step in management pending EGD/COLONOSCOPY results.    LOS: 1 day    Ozias Dicenzo M  04/01/2017, 9:21 AM  Cell [phone] If no answer or after 5 PM call [phone]

## 2017-04-01 NOTE — Progress Notes (Signed)
  Echocardiogram 2D Echocardiogram has been performed.  Darlina Sicilian M 04/01/2017, 2:48 PM

## 2017-04-02 ENCOUNTER — Inpatient Hospital Stay (HOSPITAL_COMMUNITY): Payer: Medicare Other

## 2017-04-02 ENCOUNTER — Inpatient Hospital Stay (HOSPITAL_COMMUNITY): Payer: Medicare Other | Admitting: Certified Registered Nurse Anesthetist

## 2017-04-02 ENCOUNTER — Encounter (HOSPITAL_COMMUNITY)
Admission: EM | Disposition: A | Discharge status: Home / Self Care | Payer: Self-pay | Source: Home / Self Care | Attending: Family Medicine

## 2017-04-02 DIAGNOSIS — R160 Hepatomegaly, not elsewhere classified: Secondary | ICD-10-CM

## 2017-04-02 DIAGNOSIS — D500 Iron deficiency anemia secondary to blood loss (chronic): Secondary | ICD-10-CM

## 2017-04-02 DIAGNOSIS — D509 Iron deficiency anemia, unspecified: Secondary | ICD-10-CM

## 2017-04-02 DIAGNOSIS — E872 Acidosis: Secondary | ICD-10-CM

## 2017-04-02 DIAGNOSIS — R911 Solitary pulmonary nodule: Secondary | ICD-10-CM

## 2017-04-02 DIAGNOSIS — C182 Malignant neoplasm of ascending colon: Secondary | ICD-10-CM

## 2017-04-02 HISTORY — PX: COLONOSCOPY WITH PROPOFOL: SHX5780

## 2017-04-02 HISTORY — PX: ESOPHAGOGASTRODUODENOSCOPY (EGD) WITH PROPOFOL: SHX5813

## 2017-04-02 LAB — UIFE/LIGHT CHAINS/TP QN, 24-HR UR
% BETA, URINE: 0 %
ALPHA 1 URINE: 0 %
ALPHA 2 UR: 0 %
Albumin, U: 0 %
FREE LAMBDA LT CHAINS, UR: 2 mg/L (ref 0.24–6.66)
Free Kappa/Lambda Ratio: 6.6 (ref 2.04–10.37)
Free Lt Chn Excr Rate: 13.2 mg/L (ref 1.35–24.19)
GAMMA GLOBULIN URINE: 0 %
TOTAL PROTEIN, URINE-UPE24: 11.9 mg/dL

## 2017-04-02 LAB — PROTEIN ELECTROPHORESIS, SERUM
A/G RATIO SPE: 1.1 (ref 0.7–1.7)
ALBUMIN ELP: 3 g/dL (ref 2.9–4.4)
Alpha-1-Globulin: 0.2 g/dL (ref 0.0–0.4)
Alpha-2-Globulin: 0.8 g/dL (ref 0.4–1.0)
Beta Globulin: 0.9 g/dL (ref 0.7–1.3)
GAMMA GLOBULIN: 0.9 g/dL (ref 0.4–1.8)
Globulin, Total: 2.8 g/dL (ref 2.2–3.9)
TOTAL PROTEIN ELP: 5.8 g/dL — AB (ref 6.0–8.5)

## 2017-04-02 LAB — GASTROINTESTINAL PANEL BY PCR, STOOL (REPLACES STOOL CULTURE)
ASTROVIRUS: NOT DETECTED
Adenovirus F40/41: NOT DETECTED
CRYPTOSPORIDIUM: NOT DETECTED
CYCLOSPORA CAYETANENSIS: NOT DETECTED
Campylobacter species: NOT DETECTED
ENTEROAGGREGATIVE E COLI (EAEC): NOT DETECTED
ENTEROPATHOGENIC E COLI (EPEC): NOT DETECTED
Entamoeba histolytica: NOT DETECTED
Enterotoxigenic E coli (ETEC): NOT DETECTED
GIARDIA LAMBLIA: NOT DETECTED
Norovirus GI/GII: NOT DETECTED
Plesimonas shigelloides: NOT DETECTED
ROTAVIRUS A: NOT DETECTED
Salmonella species: NOT DETECTED
Sapovirus (I, II, IV, and V): NOT DETECTED
Shiga like toxin producing E coli (STEC): NOT DETECTED
Shigella/Enteroinvasive E coli (EIEC): NOT DETECTED
VIBRIO SPECIES: NOT DETECTED
Vibrio cholerae: NOT DETECTED
YERSINIA ENTEROCOLITICA: NOT DETECTED

## 2017-04-02 LAB — CBC
HCT: 27 % — ABNORMAL LOW (ref 36.0–46.0)
HEMATOCRIT: 27.4 % — AB (ref 36.0–46.0)
Hemoglobin: 8.7 g/dL — ABNORMAL LOW (ref 12.0–15.0)
Hemoglobin: 8.8 g/dL — ABNORMAL LOW (ref 12.0–15.0)
MCH: 25.2 pg — ABNORMAL LOW (ref 26.0–34.0)
MCH: 25.4 pg — AB (ref 26.0–34.0)
MCHC: 32.2 g/dL (ref 30.0–36.0)
MCHC: 32.1 g/dL (ref 30.0–36.0)
MCV: 78.3 fL (ref 78.0–100.0)
MCV: 79 fL (ref 78.0–100.0)
PLATELETS: 369 10*3/uL (ref 150–400)
Platelets: 354 10*3/uL (ref 150–400)
RBC: 3.45 MIL/uL — AB (ref 3.87–5.11)
RBC: 3.47 MIL/uL — ABNORMAL LOW (ref 3.87–5.11)
RDW: 17.8 % — ABNORMAL HIGH (ref 11.5–15.5)
RDW: 17.9 % — ABNORMAL HIGH (ref 11.5–15.5)
WBC: 7.2 10*3/uL (ref 4.0–10.5)
WBC: 6.8 10*3/uL (ref 4.0–10.5)

## 2017-04-02 LAB — GLIADIN ANTIBODIES, SERUM
Antigliadin Abs, IgA: 2 units (ref 0–19)
Gliadin IgG: 2 units (ref 0–19)

## 2017-04-02 LAB — CALCIUM / CREATININE RATIO, URINE
CALCIUM UR: 1.1 mg/dL
CREATININE, UR: 14 mg/dL
Calcium/Creat.Ratio: 79 mg/g creat (ref 0–260)

## 2017-04-02 LAB — FOLATE RBC
FOLATE, RBC: 1582 ng/mL (ref >498)
Folate, Hemolysate: 444.5 ng/mL
Hematocrit: 28.1 % — ABNORMAL LOW (ref 34.0–46.6)

## 2017-04-02 LAB — BASIC METABOLIC PANEL
Anion gap: 10 (ref 5–15)
BUN: 15 mg/dL (ref 6–20)
CALCIUM: 8.9 mg/dL (ref 8.9–10.3)
CO2: 16 mmol/L — AB (ref 22–32)
Chloride: 116 mmol/L — ABNORMAL HIGH (ref 101–111)
Creatinine, Ser: 1.74 mg/dL — ABNORMAL HIGH (ref 0.44–1.00)
GFR calc Af Amer: 33 mL/min — ABNORMAL LOW (ref >60)
GFR calc non Af Amer: 28 mL/min — ABNORMAL LOW (ref >60)
GLUCOSE: 128 mg/dL — AB (ref 65–99)
Potassium: 3.8 mmol/L (ref 3.5–5.1)
Sodium: 142 mmol/L (ref 135–145)

## 2017-04-02 LAB — TISSUE TRANSGLUTAMINASE, IGA: Tissue Transglutaminase Ab, IgA: <2 U/mL (ref 0–3)

## 2017-04-02 LAB — CREATININE, URINE, RANDOM: Creatinine, Urine: 19.96 mg/dL

## 2017-04-02 SURGERY — ESOPHAGOGASTRODUODENOSCOPY (EGD) WITH PROPOFOL
Anesthesia: Monitor Anesthesia Care | Laterality: Left

## 2017-04-02 MED ORDER — PROPOFOL 500 MG/50ML IV EMUL
INTRAVENOUS | Status: DC | PRN
  Administered 2017-04-02: 100 ug/kg/min via INTRAVENOUS

## 2017-04-02 MED ORDER — LIDOCAINE 2% (20 MG/ML) 5 ML SYRINGE
INTRAMUSCULAR | Status: AC
  Filled 2017-04-02: qty 5

## 2017-04-02 MED ORDER — ONDANSETRON HCL 4 MG/2ML IJ SOLN
INTRAMUSCULAR | Status: AC
  Filled 2017-04-02: qty 2

## 2017-04-02 MED ORDER — SODIUM BICARBONATE 650 MG PO TABS
650.0000 mg | ORAL_TABLET | Freq: Three times a day (TID) | ORAL | Status: DC
  Administered 2017-04-02 – 2017-04-04 (×6): 650 mg via ORAL
  Filled 2017-04-02 (×6): qty 1

## 2017-04-02 MED ORDER — LACTATED RINGERS IV SOLN
INTRAVENOUS | Status: AC
  Administered 2017-04-02 – 2017-04-03 (×2): via INTRAVENOUS

## 2017-04-02 MED ORDER — LIDOCAINE 2% (20 MG/ML) 5 ML SYRINGE
INTRAMUSCULAR | Status: DC | PRN
  Administered 2017-04-02: 50 mg via INTRAVENOUS

## 2017-04-02 MED ORDER — PROPOFOL 10 MG/ML IV BOLUS
INTRAVENOUS | Status: AC
  Filled 2017-04-02: qty 60

## 2017-04-02 MED ORDER — EPHEDRINE SULFATE-NACL 50-0.9 MG/10ML-% IV SOSY
PREFILLED_SYRINGE | INTRAVENOUS | Status: DC | PRN
  Administered 2017-04-02: 900 mg via INTRAVENOUS

## 2017-04-02 MED ORDER — PROPOFOL 10 MG/ML IV BOLUS
INTRAVENOUS | Status: DC | PRN
  Administered 2017-04-02: 20 mg via INTRAVENOUS
  Administered 2017-04-02 (×2): 30 mg via INTRAVENOUS
  Administered 2017-04-02 (×3): 20 mg via INTRAVENOUS

## 2017-04-02 MED ORDER — ONDANSETRON HCL 4 MG/2ML IJ SOLN
INTRAMUSCULAR | Status: DC | PRN
  Administered 2017-04-02: 4 mg via INTRAVENOUS

## 2017-04-02 SURGICAL SUPPLY — 24 items

## 2017-04-02 NOTE — Anesthesia Preprocedure Evaluation (Addendum)
Anesthesia Evaluation  Patient identified by MRN, date of birth, ID band Patient awake    Reviewed: Allergy & Precautions, NPO status , Patient's Chart, lab work & pertinent test results  Airway Mallampati: II  TM Distance: >3 FB Neck ROM: Full    Dental no notable dental hx.    Pulmonary asthma ,    Pulmonary exam normal breath sounds clear to auscultation       Cardiovascular negative cardio ROS Normal cardiovascular exam Rhythm:Regular Rate:Normal     Neuro/Psych negative neurological ROS  negative psych ROS   GI/Hepatic Neg liver ROS, GERD  ,  Endo/Other  negative endocrine ROS  Renal/GU negative Renal ROS     Musculoskeletal negative musculoskeletal ROS (+)   Abdominal   Peds  Hematology  (+) Blood dyscrasia, anemia ,   Anesthesia Other Findings   Reproductive/Obstetrics negative OB ROS                             Anesthesia Physical Anesthesia Plan  ASA: II  Anesthesia Plan: MAC   Post-op Pain Management:    Induction:   PONV Risk Score and Plan: 2 and Ondansetron, Propofol infusion and Treatment may vary due to age or medical condition  Airway Management Planned:   Additional Equipment:   Intra-op Plan:   Post-operative Plan:   Informed Consent: I have reviewed the patients History and Physical, chart, labs and discussed the procedure including the risks, benefits and alternatives for the proposed anesthesia with the patient or authorized representative who has indicated his/her understanding and acceptance.     Plan Discussed with:   Anesthesia Plan Comments:         Anesthesia Quick Evaluation

## 2017-04-02 NOTE — Consult Note (Signed)
New Hematology/Oncology Consult   Referral MD: Dr. Florene Glen  Reason for Referral: Colon cancer  HPI: Ms. Jasmine Cuevas is a 71 year old woman with no significant past medical history who presented to the emergency department 03/31/2017 following acute onset abdominal pain, vomiting and diarrhea. She also had dizziness. Labs on arrival to the emergency department showed a hemoglobin of 5.8, MCV 73; stool positive for occult blood. Brain CT showed no acute intracranial abnormality. Brain MRI also showed no acute abnormality. 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 CT abdomen/pelvis without contrast showed a suspicious mass involving the proximal ascending colon at the level of the ileocecal valve. A large, complex multiloculated cystic mass was noted that appeared to arise from the undersurface of the left lobe of the liver. A 3 mm right lower lobe lung nodule was also noted. She underwent a colonoscopy earlier today with findings of a fungating, infiltrative, polypoid and ulcerated large mass in the proximal ascending colon with partial involvement of the cecum. The mass was partially circumferential. No bleeding was present. Biopsies were obtained with pathology pending. She also underwent an upper endoscopy which showed granular mucosa in the prepyloric region of the stomach which was biopsied.    Past Medical History:  Diagnosis Date  . Asthma   . GERD (gastroesophageal reflux disease)   :  History reviewed. No pertinent surgical history.:   Current Facility-Administered Medications:  .  0.9 %  sodium chloride infusion, , Intravenous, Once, Tegeler, Gwenyth Allegra, MD .  acetaminophen (TYLENOL) tablet 650 mg, 650 mg, Oral, Q6H PRN **OR** acetaminophen (TYLENOL) suppository 650 mg, 650 mg, Rectal, Q6H PRN, Jani Gravel, MD .  cefTRIAXone (ROCEPHIN) 1 g in dextrose 5 % 50 mL IVPB, 1 g, Intravenous, Q24H, Jani Gravel, MD, Stopped at 04/01/17 2029 .   chlorhexidine (PERIDEX) 0.12 % solution 15 mL, 15 mL, Mouth Rinse, BID, Jani Gravel, MD, 15 mL at 04/01/17 1000 .  ferrous sulfate tablet 325 mg, 325 mg, Oral, BID WC, Elodia Florence., MD, 325 mg at 04/01/17 1753 .  lactated ringers infusion, , Intravenous, Continuous, Elodia Florence., MD, Stopped at 04/02/17 1341 .  MEDLINE mouth rinse, 15 mL, Mouth Rinse, q12n4p, Jani Gravel, MD, 15 mL at 04/01/17 1200 .  sodium bicarbonate tablet 650 mg, 650 mg, Oral, TID, Elodia Florence., MD:  . chlorhexidine  15 mL Mouth Rinse BID  . ferrous sulfate  325 mg Oral BID WC  . mouth rinse  15 mL Mouth Rinse q12n4p  . sodium bicarbonate  650 mg Oral TID  :  Allergies  Allergen Reactions  . Aspirin     Eye swelling   :  FH: Maternal aunt with colon cancer. Mother with "brain cancer".   SOCIAL HISTORY: She is originally from [place]. She now lives in [place] with her husband. She has 2 children. She is retired. She was previously employed as a Research scientist (physical sciences) at [employer]. No alcohol or tobacco use. Prior to the current hospitalization she has never had a blood transfusion. No risk factors for HIV or hepatitis.  Review of Systems: She reports acute onset of abdominal pain, vomiting and diarrhea on the day of hospital admission. She was also experiencing dizziness. Prior to that she was feeling well. She has a good appetite. No weight loss. She denies fever and sweats. No shortness of breath. 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 No change in bowel  habits. No bloody or black stools. No urinary symptoms. No rash. She has never had a blood clot. No focal extremity weakness.  Physical Exam:  Blood pressure (!) 147/74, pulse 79, temperature 98.4 F (36.9 C), temperature source Oral, resp. rate 18, height 5\' 5"  (1.651 m), weight 185 lb 10 oz (84.2 kg), SpO2 100 %.  HEENT: no thrush or ulcers. Lungs: lungs clear bilaterally. Cardiac: regular rate and  rhythm. Abdomen: abdomen soft and nontender. No hepatomegaly. No mass. Vascular: no leg edema. Lymph nodes: no palpable cervical, supraclavicular, axillary or inguinal lymph nodes. Neurologic: alert and oriented. Motor strength intact. Skin: no rash.  LABS:   Recent Labs  04/01/17 0413 04/02/17 0346  WBC 7.2 6.8  HGB 8.7* 8.8*  HCT 27.0*  28.1* 27.4*  PLT 369 354     Recent Labs  04/01/17 0856 04/02/17 0346  NA 148* 142  K 3.9 3.8  CL 120* 116*  CO2 20* 16*  GLUCOSE 99 128*  BUN 23* 15  CREATININE 1.77* 1.74*  CALCIUM 9.3 8.9      RADIOLOGY:  Ct Abdomen Pelvis Wo Contrast  Result Date: 04/01/2017 CLINICAL DATA:  Abdominal pain.  Nausea and vomiting EXAM: CT ABDOMEN AND PELVIS WITHOUT CONTRAST TECHNIQUE: Multidetector CT imaging of the abdomen and pelvis was performed following the standard protocol without IV contrast. COMPARISON:  None FINDINGS: Lower chest: 3 mm right lower lobe lung nodule is identified, image number 19 of series 4. No acute abnormality identified. Hepatobiliary: There is a large complex lobulated multi septated cystic mass which appears to arise from the undersurface of the left lobe of liver. This measures 9.5 by 12.0 by 12.5 cm. There may be a vascular pedicle communicating between this mass and the area around the falciform ligament. This is incompletely characterized without IV contrast. No focal abnormalities noted within the right lobe of liver. The gallbladder appears normal. Mild increase caliber of the common bile duct which measures 1.2 cm. Pancreas: Unremarkable. No pancreatic ductal dilatation or surrounding inflammatory changes. Spleen: Normal in size without focal abnormality. Adrenals/Urinary Tract: The adrenal glands are normal. Unremarkable appearance of the kidneys. No mass or hydronephrosis. Urinary bladder appears normal. Stomach/Bowel: The stomach is normal. The small bowel loops have a normal course caliber. There is a mass  involving the proximal ascending colon at the level of the ileocecal valve. This measures 3.8 x 4.0 by 3.6 cm, image 59 of series 2 and image 25 of series 5. No obstruction. Enteric contrast material is noted within the colon distal to this mass. Vascular/Lymphatic: Mild aortic atherosclerosis. No aneurysm. No adenopathy within the small bowel mesenteric. No pelvic or inguinal adenopathy. Reproductive: Calcified uterine fibroids noted.  No adnexal mass. Other: No free fluid or fluid collections identified within the abdomen or pelvis. Musculoskeletal: No aggressive lytic or sclerotic bone lesions. IMPRESSION: 1. There is a suspicious mass involving the proximal ascending colon at the level of the ileocecal valve. This is worrisome for a nonobstructing colonic neoplasm. Correlation with direct visualization is advised. 2. There is a large, complex multi loculated cystic mass which appears to arise from the undersurface of the left lobe of liver. 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 Further investigation with contrast enhanced liver protocol MRI is advised. 3. 3 mm right lower lobe lung nodule is identified. No follow-up needed if patient is low-risk. Non-contrast chest CT can be considered in 12 months if patient  is high-risk. This recommendation follows the consensus statement: Guidelines for Management of Incidental Pulmonary Nodules Detected on CT Images: From the Fleischner Society 2017; Radiology 2017; 284:228-243. Electronically Signed   By: Kerby Moors M.D.   On: 04/01/2017 16:50   Dg Chest 2 View  Result Date: 03/31/2017 CLINICAL DATA:  Acute onset of cough, chest congestion and diarrhea. EXAM: CHEST  2 VIEW COMPARISON:  None. FINDINGS: AP semi-erect and lateral images were obtained. Cardiac silhouette normal in size for AP technique. Thoracic aorta mildly tortuous.  Hilar and mediastinal contours otherwise unremarkable. Lungs clear. Bronchovascular markings normal. Pulmonary vascularity normal. No visible pleural effusions. No pneumothorax. Degenerative changes involving the thoracic and upper lumbar spine. IMPRESSION: No acute cardiopulmonary disease. Electronically Signed   By: Evangeline Dakin M.D.   On: 03/31/2017 17:50   Ct Head Wo Contrast  Result Date: 03/31/2017 CLINICAL DATA:  71 year old female felt dizzy on toilet. Diarrhea. Initial encounter. EXAM: CT HEAD WITHOUT CONTRAST TECHNIQUE: Contiguous axial images were obtained from the base of the skull through the vertex without intravenous contrast. COMPARISON:  None. FINDINGS: Brain: No intracranial hemorrhage or CT evidence of large acute infarct. Left frontal calvarium 11 mm calcified/ossified projection may represent focal area of hyperostosis frontalis interna or calcified meningioma. No surrounding vasogenic edema or significant mass effect. Vascular: Vascular calcifications Skull: No acute abnormality. Sinuses/Orbits: Visualized orbital structures unremarkable. Visualized paranasal sinuses clear. Other: Mastoid air cells and middle ear cavities clear. IMPRESSION: No acute intracranial abnormality. Specifically, no intracranial hemorrhage or CT evidence of large acute infarct. Left frontal calvarium 11 mm calcified/ossified projection may represent focal area of hyperostosis frontalis interna or calcified meningioma. No surrounding vasogenic edema or significant mass effect. Electronically Signed   By: Genia Del M.D.   On: 03/31/2017 18:05   Mr Brain Wo Contrast  Result Date: 04/01/2017 CLINICAL DATA:  Vertigo.  Nausea and vomiting. EXAM: MRI HEAD WITHOUT CONTRAST TECHNIQUE: Multiplanar, multiecho pulse sequences of the brain and surrounding structures were obtained without intravenous contrast. COMPARISON:  03/31/2017 head CT FINDINGS: Brain: There is no evidence of acute infarct, intracranial  hemorrhage, midline shift, or extra-axial fluid collection. Mild generalized cerebral atrophy is within normal limits for age. No significant cerebral white matter disease is seen. 1 cm extra-axial focus of T2 hypointensity and susceptibility along the inner table of the left frontal skull corresponds to the dense calcification/ossification on CT and may represent focal hyperostosis or meningioma. There is no associated mass effect or brain edema. Vascular: Major intracranial vascular flow voids are preserved. Skull and upper cervical spine: Unremarkable bone marrow signal. Sinuses/Orbits: Unremarkable orbits. Minimal right maxillary sinus mucosal thickening. Clear mastoid air cells. Other: None. IMPRESSION: 1. No acute intracranial abnormality. 2. 1 cm focal ossification or densely calcified meningioma along the left frontal skull. No mass effect or edema. Electronically Signed   By: Logan Bores M.D.   On: 04/01/2017 07:17    Assessment and Plan:   1. Colon cancer, ascending colon  CT abdomen/pelvis10/24/2018-large complex lobulated multiseptated cystic mass arising from the undersurface of the left lobe of the liver measuring 9.5 x 12 x 12.5 cm. Mass involving the proximal ascending colon at the level of the ileocecal valve measuring 3.8 x 4.0 x 3.6 cm. No obstruction. 3 mm right lower lobe lung nodule.  Colonoscopy 04/02/2017-fungating, infiltrative, polypoid and ulcerated large mass in the proximal ascending colon with partial involvement of the cecum. Biopsy result pending. 2. Anemia secondary to GI blood loss and iron deficiency 3.  Large cystic liver mass 4. Upper endoscopy 04/02/2017-granular mucosa and the prepyloric region of the stomach status post biopsy  Ms. Dicocco appears to have a right side colon cancer. She also has a large liver mass of uncertain etiology, not clearly related to the colon cancer.    Recommendations: 1. Surgery consult to consider resection of the colon mass,  evaluation of liver mass 2. Check CEA 3. Continue oral iron 4. Outpatient oncology follow-up  Ned Card, NP 04/02/2017, 3:56 PM   Ms. Hartis was interviewed and examined. CT images were reviewed.  She was admitted with severe symptomatic area deficiency anemia. A colonoscopy confirmed an ascending colon mass.  She appears to have colon cancer. The large cystic liver mass could represent an isolated metastasis, but is more likely secondary to a different process.  I recommend surgical evaluation to consider resection of the primary tumor and evaluation of the liver mass.  Outpatient follow-up will be scheduled at the [facility].  Julieanne Manson, M.D.

## 2017-04-02 NOTE — Brief Op Note (Addendum)
03/31/2017 - 04/02/2017  1:25 PM  PATIENT:  Jasmine Cuevas  71 y.o. female  PRE-OPERATIVE DIAGNOSIS:  anemia  POST-OPERATIVE DIAGNOSIS:  gastritis external hemorrhoids ascending mass   PROCEDURE:  Procedure(s): ESOPHAGOGASTRODUODENOSCOPY (EGD) WITH PROPOFOL (Left) COLONOSCOPY WITH PROPOFOL (Left)  SURGEON:  Surgeon(s) and Role:    * Elaya Droege, MD - Primary  Findings /recommendations ------------------------------------ - Colonoscopy showed large ulcerated and friable proximal ascending colon mass. Likely malignant. Multiple biopsies taken. - Recommend oncology consult. Patient has a large liver lesion which could be metastatic. - May need surgical evaluation prior to discharge depending oncology recommendation. - Clear liquid diet today. GI will follow - Case discussed with Dr. Benay Spice  over the phone. Possibility of colon cancer also discussed with patient's husband over the phone.   Otis Brace MD, [place] 04/02/2017, 1:26 PM  Pager [phone]  If no answer or after 5 PM call [phone]

## 2017-04-02 NOTE — Op Note (Signed)
[facility] Patient Name: Jasmine Cuevas Procedure Date: 04/02/2017 MRN: [id number] Attending MD: Otis Brace , MD Date of Birth: Dec 25, 1945 CSN: [id number] Age: 71 Admit Type: Outpatient Procedure:                Upper GI endoscopy Indications:              Suspected upper gastrointestinal bleeding in                            patient with unexplained iron deficiency anemia Providers:                Carmie End, RN, Alan Mulder, Technician,                            Otis Brace, MD Referring MD:              Medicines:                Sedation Administered by an Anesthesia Professional Complications:            No immediate complications. Estimated Blood Loss:     Estimated blood loss was minimal. Procedure:                Pre-Anesthesia Assessment:                           - Prior to the procedure, a History and Physical                            was performed, and patient medications and                            allergies were reviewed. The patient's tolerance of                            previous anesthesia was also reviewed. The risks                            and benefits of the procedure and the sedation                            options and risks were discussed with the patient.                            All questions were answered, and informed consent                            was obtained. Prior Anticoagulants: The patient has                            taken no previous anticoagulant or antiplatelet                            agents. ASA Grade Assessment: II - A patient with  mild systemic disease. After reviewing the risks                            and benefits, the patient was deemed in                            satisfactory condition to undergo the procedure.                           After obtaining informed consent, the endoscope was                            passed under direct vision. Throughout  the                            procedure, the patient's blood pressure, pulse, and                            oxygen saturations were monitored continuously. The                            [id number] ([id number]) scope was introduced through the                            mouth, and advanced to the second part of duodenum.                            The patient tolerated the procedure well. The upper                            GI endoscopy was performed with moderate difficulty                            due to the patient's discomfort during the                            procedure. Successful completion of the procedure                            was aided by increasing the dose of sedation                            medication. Scope In: Scope Out: Findings:      Localized moderate mucosal changes characterized by granularity were       found in the prepyloric region of the stomach. Biopsies were taken with       a cold forceps for histology.      The cardia and gastric fundus were normal on retroflexion.      The duodenal bulb, first portion of the duodenum and second portion of       the duodenum were normal.      A non-obstructing Schatzki ring (acquired) was found in the lower third       of the esophagus.      The exam of the esophagus  was otherwise normal. Impression:               - Granular mucosa in the prepyloric region of the                            stomach. Biopsied.                           - Normal duodenal bulb, first portion of the                            duodenum and second portion of the duodenum.                           - Non-obstructing Schatzki ring. Moderate Sedation:      Moderate (conscious) sedation was personally administered by an       anesthesia professional. The following parameters were monitored: oxygen       saturation, heart rate, blood pressure, and response to care. Recommendation:           - Perform a colonoscopy today.                            - Resume previous diet.                           - Continue present medications. Procedure Code(s):        --- Professional ---                           [phone], Esophagogastroduodenoscopy, flexible,                            transoral; with biopsy, single or multiple Diagnosis Code(s):        --- Professional ---                           K31.89, Other diseases of stomach and duodenum                           D50.9, Iron deficiency anemia, unspecified CPT copyright 2016 American Medical Association. All rights reserved. The codes documented in this report are preliminary and upon coder review may  be revised to meet current compliance requirements. Otis Brace, MD Otis Brace, MD 04/02/2017 1:17:02 PM Number of Addenda: 0

## 2017-04-02 NOTE — Progress Notes (Addendum)
Jasmine Cuevas is a 71 y.o. female has presented with symptomatic anemia and occult blood positive stool  The various methods of treatment have been discussed with the patient and family. After consideration of risks, benefits and other options for treatment, the patient has consented to  Procedure(s): EGD and colonoscopy as a surgical intervention .  The patient's history has been reviewed, patient examined, no change in status, stable for surgery.  I have reviewed the patient's chart and labs.  Questions were answered to the patient's satisfaction.   CT scan results reviewed. Possible mass at the proximal ascending colon near ileocecal wall. Also large cystic lesion in the liver concerning for cystadenoma/adenocarcinoma.

## 2017-04-02 NOTE — Progress Notes (Signed)
Pt gone for procedure at this this time. Will check back on pt next day.  [place], [place]

## 2017-04-02 NOTE — Progress Notes (Signed)
PT Cancellation Note  Patient Details Name: Jasmine Cuevas MRN: [id number] DOB: 1945/06/16   Cancelled Treatment:    Reason Eval/Treat Not Completed: Patient at procedure or test/unavailable--will check back on tomorrow.    Weston Anna, MPT Pager: [phone]

## 2017-04-02 NOTE — Anesthesia Procedure Notes (Signed)
Date/Time: 04/02/2017 12:30 PM Performed by: Claudia Desanctis Oxygen Delivery Method: Nasal cannula

## 2017-04-02 NOTE — Op Note (Signed)
[facility] Patient Name: Jasmine Cuevas Procedure Date: 04/02/2017 MRN: [id number] Attending MD: Otis Brace , MD Date of Birth: 04/18/1946 CSN: [id number] Age: 71 Admit Type: Outpatient Procedure:                Colonoscopy Indications:              Iron deficiency anemia, Abnormal CT of the GI tract Providers:                Carmie End, RN, Alan Mulder, Technician,                            Otis Brace, MD Referring MD:              Medicines:                Sedation Administered by an Anesthesia Professional Complications:            No immediate complications. Estimated Blood Loss:     Estimated blood loss was minimal. Procedure:                Pre-Anesthesia Assessment:                           - Prior to the procedure, a History and Physical                            was performed, and patient medications and                            allergies were reviewed. The patient's tolerance of                            previous anesthesia was also reviewed. The risks                            and benefits of the procedure and the sedation                            options and risks were discussed with the patient.                            All questions were answered, and informed consent                            was obtained. Prior Anticoagulants: The patient has                            taken no previous anticoagulant or antiplatelet                            agents. ASA Grade Assessment: II - A patient with                            mild systemic disease. After reviewing the risks  and benefits, the patient was deemed in                            satisfactory condition to undergo the procedure.                           After obtaining informed consent, the colonoscope                            was passed under direct vision. Throughout the                            procedure, the patient's blood pressure,  pulse, and                            oxygen saturations were monitored continuously. The                            EC-[id number] ([id number]) scope was introduced through                            the anus and advanced to the the cecum, identified                            by appendiceal orifice and ileocecal valve. The                            colonoscopy was technically difficult and complex                            due to significant looping and a tortuous colon.                            Successful completion of the procedure was aided by                            changing the patient to a supine position and                            applying abdominal pressure. The patient tolerated                            the procedure well. The quality of the bowel                            preparation was good. The ileocecal valve and the                            rectum were photographed. The quality of the bowel                            preparation was good. Scope In: 12:36:27 PM Scope Out: 1:00:17 PM Scope Withdrawal Time: 0 hours 3 minutes 14 seconds  Total Procedure Duration: 0 hours 23 minutes 50 seconds  Findings:      The perianal exam findings include non-thrombosed external hemorrhoids.      The colon (entire examined portion) revealed moderately excessive       looping. Advancing the scope required changing the patient to a supine       position as well as abdominal pressure. A few small polyps might have       been missed because of the significantly difficult procedure      A fungating, infiltrative, polypoid and ulcerated large mass was found       in the proximal ascending colon with partial involvement of the cecum.       The mass was partially circumferential (involving one-half of the lumen       circumference). No bleeding was present. Biopsies were taken with a cold       forceps for histology.      Internal hemorrhoids were found during retroflexion. Retroflexion  was       difficult as patient was not able to retain air. The hemorrhoids were       small. Impression:               - Non-thrombosed external hemorrhoids found on                            perianal exam.                           - There was significant looping of the colon.                           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                            Biopsied.                           - Internal hemorrhoids. Moderate Sedation:      Moderate (conscious) sedation was personally administered by an       anesthesia professional. The following parameters were monitored: oxygen       saturation, heart rate, blood pressure, and response to care. Recommendation:           - Return patient to hospital ward for ongoing care.                           - Clear liquid diet.                           - Continue present medications.                           - Await pathology results.                           - Repeat colonoscopy in 1 year for surveillance.                           - Refer to a surgeon today. Procedure Code(s):        ---  Professional ---                           [phone], Colonoscopy, flexible; with biopsy, single                            or multiple Diagnosis Code(s):        --- Professional ---                           K64.4, Residual hemorrhoidal skin tags                           K64.8, Other hemorrhoids                           C18.2, Malignant neoplasm of ascending colon                           D50.9, Iron deficiency anemia, unspecified                           R93.3, Abnormal findings on diagnostic imaging of                            other parts of digestive tract CPT copyright 2016 [employer]. All rights reserved. The codes documented in this report are preliminary and upon coder review may  be revised to meet current compliance requirements. Otis Brace, MD Otis Brace, MD 04/02/2017 1:24:36 PM Number  of Addenda: 0

## 2017-04-02 NOTE — Consult Note (Signed)
Reason for Consult: Ascending colon mass  Referring Physician: Dr. Janyce Llanos Chief complaint: Lightheaded numbness, vertigo and head spinning. Primary CARE:System, Pcp Not In Jasmine Cuevas is an 70 y.o. female.    HPI: Patient is a 71 year old female who presented to the emergency department yesterday complaining of having diarrhea 1 hour ago on the toilet felt dizzy had stool incontinence in the shower.  Ongoing dry heaves.  Mild abdominal pain.  Patient denied hematemesis or coffee-ground emesis.  No history of constipation bright red blood in her stool or black stools.  She was in good health before Tuesday when this all occurred.  Apparently never had a sick day in her life.  Workup in the ED shows she is afebrile her vital signs are stable.  Creatinine is 1.88, glucose 181, LFTs are normal lipase is 29.  WBC was 9.3 hemoglobin 5.8 hematocrit 18.9 platelets 398,000.  TSH 1.213.  Urinalysis was normal except for many bacteria and 6-30 white cells per high-powered field.  Fecal occult was positive for blood.  CT of the head Showed no acute intracranial abnormality.  MRI of the brain showed no acute infarct intracranial hemorrhage midline shift or extra-axial fluid collection.  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  CT of the abdomen showed a suspicious mass involving the ascending colon at the level of the ileocecal valve, worrisome for a nonobstructing colonic neoplasm.  There was a large complex multiloculated cystic mass that arises from the undersurface of the left lobe of the liver measures 9.5 x 12 x 12.5 cm. Primary differential included that of biliary cysts adenoma or adenocarcinoma.  3 mm right lower lobe nodule identified.  She was admitted by medicine, and seen by gastroenterology Dr. Arta Silence.  She was transfused and then scheduled for endoscopy and colonoscopy today.  She is found to have  non-thrombosed hemorrhoids on perianal exam.  There is a significant looping of the colon.  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  Endoscopy was normal with some granular mucosa in the pre-pyloric region of the stomach, some gastritis.  We are asked to see. . sodium chloride    . cefTRIAXone (ROCEPHIN)  IV Stopped (04/01/17 2029)  . lactated ringers Stopped (04/02/17 1341)   Anti-infectives    Start     Dose/Rate Route Frequency Ordered Stop   03/31/17 2000  cefTRIAXone (ROCEPHIN) 1 g in dextrose 5 % 50 mL IVPB     1 g 100 mL/hr over 30 Minutes Intravenous Every 24 hours 03/31/17 1957        Past Medical History:  Diagnosis Date  . Asthma   . GERD (gastroesophageal reflux disease)     History reviewed. No pertinent surgical history.  Family History  Problem Relation Age of Onset  . Brain cancer Mother   . Heart attack Father     Social History:  reports that she has never smoked. She has never used smokeless tobacco. She reports that she does not drink alcohol or use drugs. Tobacco: None Drugs: None EtOH: Rare social use in the past currently none.  She lives with her husband. Allergies:  Allergies  Allergen Reactions  . Aspirin     Eye swelling     Prior to Admission medications   Not on File      Results for orders placed or performed during the hospital encounter of 03/31/17 (from the  past 48 hour(s))  CBC with Differential     Status: Abnormal   Collection Time: 03/31/17  5:07 PM  Result Value Ref Range   WBC 9.3 4.0 - 10.5 K/uL   RBC 2.59 (L) 3.87 - 5.11 MIL/uL   Hemoglobin 5.8 (LL) 12.0 - 15.0 g/dL    Comment: CRITICAL RESULT CALLED TO, READ BACK BY AND VERIFIED WITH: HAMBY,M @ 1746 ON [id number] BY POTEAT,S    HCT 18.9 (L) 36.0 - 46.0 %   MCV 73.0 (L) 78.0 - 100.0 fL   MCH 22.4 (L) 26.0 - 34.0 pg   MCHC 30.7 30.0 - 36.0 g/dL   RDW 16.1 (H) 11.5 - 15.5 %   Platelets 398 150 - 400 K/uL   Neutrophils Relative % 82  %   Lymphocytes Relative 12 %   Monocytes Relative 4 %   Eosinophils Relative 2 %   Basophils Relative 0 %   Neutro Abs 7.6 1.7 - 7.7 K/uL   Lymphs Abs 1.1 0.7 - 4.0 K/uL   Monocytes Absolute 0.4 0.1 - 1.0 K/uL   Eosinophils Absolute 0.2 0.0 - 0.7 K/uL   Basophils Absolute 0.0 0.0 - 0.1 K/uL   RBC Morphology POLYCHROMASIA PRESENT   Comprehensive metabolic panel     Status: Abnormal   Collection Time: 03/31/17  5:07 PM  Result Value Ref Range   Sodium 139 135 - 145 mmol/L   Potassium 3.8 3.5 - 5.1 mmol/L   Chloride 110 101 - 111 mmol/L   CO2 20 (L) 22 - 32 mmol/L   Glucose, Bld 181 (H) 65 - 99 mg/dL   BUN 31 (H) 6 - 20 mg/dL   Creatinine, Ser 1.88 (H) 0.44 - 1.00 mg/dL   Calcium 8.9 8.9 - 10.3 mg/dL   Total Protein 6.3 (L) 6.5 - 8.1 g/dL   Albumin 3.3 (L) 3.5 - 5.0 g/dL   AST 13 (L) 15 - 41 U/L   ALT 9 (L) 14 - 54 U/L   Alkaline Phosphatase 73 38 - 126 U/L   Total Bilirubin 0.4 0.3 - 1.2 mg/dL   GFR calc non Af Amer 26 (L) >60 mL/min   GFR calc Af Amer 30 (L) >60 mL/min    Comment: (NOTE) The eGFR has been calculated using the CKD EPI equation. This calculation has not been validated in all clinical situations. eGFR's persistently <60 mL/min signify possible Chronic Kidney Disease.    Anion gap 9 5 - 15  Protime-INR     Status: None   Collection Time: 03/31/17  5:07 PM  Result Value Ref Range   Prothrombin Time 13.5 11.4 - 15.2 seconds   INR 1.04   Creatinine, urine, random     Status: None   Collection Time: 03/31/17  5:07 PM  Result Value Ref Range   Creatinine, Urine 19.96 mg/dL    Comment: Performed at [facility], [place] [address] [place],  [address]  Type and screen [place]     Status: None   Collection Time: 03/31/17  6:02 PM  Result Value Ref Range   ABO/RH(D) O POS    Antibody Screen NEG    Sample Expiration 04/03/2017    Unit Number [id number]    Blood Component Type RED CELLS,LR    Unit division 00    Status of  Unit ISSUED,FINAL    Transfusion Status OK TO TRANSFUSE    Crossmatch Result Compatible    Unit Number [id number]    Blood Component  Type RED CELLS,LR    Unit division 00    Status of Unit ISSUED,FINAL    Transfusion Status OK TO TRANSFUSE    Crossmatch Result Compatible   Prepare RBC     Status: None   Collection Time: 03/31/17  6:02 PM  Result Value Ref Range   Order Confirmation ORDER PROCESSED BY BLOOD BANK   ABO/Rh     Status: None   Collection Time: 03/31/17  6:02 PM  Result Value Ref Range   ABO/RH(D) O POS   POC occult blood, ED     Status: Abnormal   Collection Time: 03/31/17  6:24 PM  Result Value Ref Range   Fecal Occult Bld POSITIVE (A) NEGATIVE  Urinalysis, Routine w reflex microscopic     Status: Abnormal   Collection Time: 03/31/17  7:00 PM  Result Value Ref Range   Color, Urine YELLOW YELLOW   APPearance CLEAR CLEAR   Specific Gravity, Urine 1.004 (L) 1.005 - 1.030   pH 7.0 5.0 - 8.0   Glucose, UA NEGATIVE NEGATIVE mg/dL   Hgb urine dipstick SMALL (A) NEGATIVE   Bilirubin Urine NEGATIVE NEGATIVE   Ketones, ur NEGATIVE NEGATIVE mg/dL   Protein, ur NEGATIVE NEGATIVE mg/dL   Nitrite NEGATIVE NEGATIVE   Leukocytes, UA MODERATE (A) NEGATIVE   RBC / HPF 0-5 0 - 5 RBC/hpf   WBC, UA 6-30 0 - 5 WBC/hpf   Bacteria, UA MANY (A) NONE SEEN   Squamous Epithelial / LPF 0-5 (A) NONE SEEN  Sodium, urine, random     Status: None   Collection Time: 03/31/17  8:22 PM  Result Value Ref Range   Sodium, Ur 32 mmol/L    Comment: Performed at [facility], [address] [place] [address], [place], [place] [address]  Calcium / creatinine ratio, urine     Status: None   Collection Time: 03/31/17  8:22 PM  Result Value Ref Range   Calcium, Ur 1.1 Not Estab. mg/dL   Calcium/Creat.Ratio 79 0 - 260 mg/g creat    Comment: (NOTE) Performed At: [facility] [address] [place], [place] [id number] Lindon Romp MD ER:[id number]    Creatinine, Urine 14.0 Not Estab. mg/dL   Lipase, blood     Status: None   Collection Time: 03/31/17  8:30 PM  Result Value Ref Range   Lipase 29 11 - 51 U/L  Urine culture     Status: Abnormal (Preliminary result)   Collection Time: 03/31/17  9:07 PM  Result Value Ref Range   Specimen Description URINE, RANDOM    Special Requests NONE    Culture (A)     >=100,000 COLONIES/mL KLEBSIELLA PNEUMONIAE SUSCEPTIBILITIES TO FOLLOW Performed at [facility], [place] [address] [place], [place] [address]    Report Status PENDING   MRSA PCR Screening     Status: None   Collection Time: 03/31/17 10:14 PM  Result Value Ref Range   MRSA by PCR NEGATIVE NEGATIVE    Comment:        The GeneXpert MRSA Assay (FDA approved for NASAL specimens only), is one component of a comprehensive MRSA colonization surveillance program. It is not intended to diagnose MRSA infection nor to guide or monitor treatment for MRSA infections.   Ferritin     Status: Abnormal   Collection Time: 04/01/17  4:13 AM  Result Value Ref Range   Ferritin 9 (L) 11 - 307 ng/mL    Comment: Performed at [facility], [place]  [address] [place], [place] [address]  Iron and TIBC     Status: Abnormal   Collection Time: 04/01/17  4:13 AM  Result Value Ref Range   Iron 33 28 - 170 ug/dL   TIBC 375 250 - 450 ug/dL   Saturation Ratios 9 (L) 10.4 - 31.8 %   UIBC 342 ug/dL    Comment: Performed at [facility], [place] [address], [place], [place] [address]  Vitamin B12     Status: None   Collection Time: 04/01/17  4:13 AM  Result Value Ref Range   Vitamin B-12 665 180 - 914 pg/mL    Comment: (NOTE) This assay is not validated for testing neonatal or myeloproliferative syndrome specimens for Vitamin B12 levels. Performed at [facility], [place] [address] [place], [place] [address]   TSH     Status: None   Collection Time: 04/01/17  4:13 AM  Result Value Ref Range   TSH 1.213 0.350 - 4.500 uIU/mL    Comment: Performed by a 3rd Generation assay  with a functional sensitivity of <=0.01 uIU/mL.  Gliadin antibodies, serum     Status: None   Collection Time: 04/01/17  4:13 AM  Result Value Ref Range   Gliadin IgG 2 0 - 19 units    Comment: (NOTE)                   Negative                   0 - 19                   Weak Positive             20 - 30                   Moderate to Strong Positive   >30 Performed At: [facility] [place], [place] [id number] Lindon Romp MD HR:[id number]    Antigliadin Abs, IgA 2 0 - 19 units    Comment: (NOTE)                   Negative                   0 - 19                   Weak Positive             20 - 30                   Moderate to Strong Positive   >30   CBC     Status: Abnormal   Collection Time: 04/01/17  4:13 AM  Result Value Ref Range   WBC 7.2 4.0 - 10.5 K/uL   RBC 3.45 (L) 3.87 - 5.11 MIL/uL   Hemoglobin 8.7 (L) 12.0 - 15.0 g/dL    Comment: DELTA CHECK NOTED REPEATED TO VERIFY POST TRANSFUSION SPECIMEN    HCT 27.0 (L) 36.0 - 46.0 %   MCV 78.3 78.0 - 100.0 fL    Comment: DELTA CHECK NOTED REPEATED TO VERIFY POST TRANSFUSION SPECIMEN    MCH 25.2 (L) 26.0 - 34.0 pg   MCHC 32.2 30.0 - 36.0 g/dL   RDW 17.8 (H) 11.5 - 15.5 %   Platelets 369 150 - 400 K/uL  Troponin I     Status: None   Collection Time: 04/01/17  4:13 AM  Result Value Ref Range   Troponin I <0.03 <0.03 ng/mL  Comprehensive metabolic panel     Status: Abnormal   Collection Time: 04/01/17  8:56 AM  Result Value Ref Range   Sodium 148 (H) 135 - 145 mmol/L    Comment: DELTA CHECK NOTED REPEATED TO VERIFY    Potassium 3.9 3.5 - 5.1 mmol/L   Chloride 120 (H) 101 - 111 mmol/L   CO2 20 (L) 22 - 32 mmol/L   Glucose, Bld 99 65 - 99 mg/dL   BUN 23 (H) 6 - 20 mg/dL   Creatinine, Ser 1.77 (H) 0.44 - 1.00 mg/dL   Calcium 9.3 8.9 - 10.3 mg/dL   Total Protein 6.4 (L) 6.5 - 8.1 g/dL   Albumin 3.2 (L) 3.5 - 5.0 g/dL   AST 15 15 - 41 U/L   ALT 11 (L) 14 - 54 U/L   Alkaline Phosphatase 70  38 - 126 U/L   Total Bilirubin 0.4 0.3 - 1.2 mg/dL   GFR calc non Af Amer 28 (L) >60 mL/min   GFR calc Af Amer 32 (L) >60 mL/min    Comment: (NOTE) The eGFR has been calculated using the CKD EPI equation. This calculation has not been validated in all clinical situations. eGFR's persistently <60 mL/min signify possible Chronic Kidney Disease.    Anion gap 8 5 - 15  Troponin I     Status: None   Collection Time: 04/01/17  8:56 AM  Result Value Ref Range   Troponin I <0.03 <0.03 ng/mL  Gastrointestinal Panel by PCR , Stool     Status: None   Collection Time: 04/01/17  1:45 PM  Result Value Ref Range   Campylobacter species NOT DETECTED NOT DETECTED   Plesimonas shigelloides NOT DETECTED NOT DETECTED   Salmonella species NOT DETECTED NOT DETECTED   Yersinia enterocolitica NOT DETECTED NOT DETECTED   Vibrio species NOT DETECTED NOT DETECTED   Vibrio cholerae NOT DETECTED NOT DETECTED   Enteroaggregative E coli (EAEC) NOT DETECTED NOT DETECTED   Enteropathogenic E coli (EPEC) NOT DETECTED NOT DETECTED   Enterotoxigenic E coli (ETEC) NOT DETECTED NOT DETECTED   Shiga like toxin producing E coli (STEC) NOT DETECTED NOT DETECTED   Shigella/Enteroinvasive E coli (EIEC) NOT DETECTED NOT DETECTED   Cryptosporidium NOT DETECTED NOT DETECTED   Cyclospora cayetanensis NOT DETECTED NOT DETECTED   Entamoeba histolytica NOT DETECTED NOT DETECTED   Giardia lamblia NOT DETECTED NOT DETECTED   Adenovirus F40/41 NOT DETECTED NOT DETECTED   Astrovirus NOT DETECTED NOT DETECTED   Norovirus GI/GII NOT DETECTED NOT DETECTED   Rotavirus A NOT DETECTED NOT DETECTED   Sapovirus (I, II, IV, and V) NOT DETECTED NOT DETECTED  C difficile quick scan w PCR reflex     Status: None   Collection Time: 04/01/17  1:45 PM  Result Value Ref Range   C Diff antigen NEGATIVE NEGATIVE   C Diff toxin NEGATIVE NEGATIVE   C Diff interpretation No C. difficile detected.   Troponin I     Status: None   Collection Time:  04/01/17  2:36 PM  Result Value Ref Range   Troponin I <0.03 <0.03 ng/mL  CBC     Status: Abnormal   Collection Time: 04/02/17  3:46 AM  Result Value Ref Range   WBC 6.8 4.0 - 10.5 K/uL   RBC 3.47 (L) 3.87 - 5.11 MIL/uL   Hemoglobin 8.8 (L) 12.0 - 15.0 g/dL   HCT 27.4 (  L) 36.0 - 46.0 %   MCV 79.0 78.0 - 100.0 fL   MCH 25.4 (L) 26.0 - 34.0 pg   MCHC 32.1 30.0 - 36.0 g/dL   RDW 17.9 (H) 11.5 - 15.5 %   Platelets 354 150 - 400 K/uL  Basic metabolic panel     Status: Abnormal   Collection Time: 04/02/17  3:46 AM  Result Value Ref Range   Sodium 142 135 - 145 mmol/L   Potassium 3.8 3.5 - 5.1 mmol/L   Chloride 116 (H) 101 - 111 mmol/L   CO2 16 (L) 22 - 32 mmol/L   Glucose, Bld 128 (H) 65 - 99 mg/dL   BUN 15 6 - 20 mg/dL   Creatinine, Ser 1.74 (H) 0.44 - 1.00 mg/dL   Calcium 8.9 8.9 - 10.3 mg/dL   GFR calc non Af Amer 28 (L) >60 mL/min   GFR calc Af Amer 33 (L) >60 mL/min    Comment: (NOTE) The eGFR has been calculated using the CKD EPI equation. This calculation has not been validated in all clinical situations. eGFR's persistently <60 mL/min signify possible Chronic Kidney Disease.    Anion gap 10 5 - 15    Ct Abdomen Pelvis Wo Contrast  Result Date: 04/01/2017 CLINICAL DATA:  Abdominal pain.  Nausea and vomiting EXAM: CT ABDOMEN AND PELVIS WITHOUT CONTRAST TECHNIQUE: Multidetector CT imaging of the abdomen and pelvis was performed following the standard protocol without IV contrast. COMPARISON:  None FINDINGS: Lower chest: 3 mm right lower lobe lung nodule is identified, image number 19 of series 4. No acute abnormality identified. Hepatobiliary: There is a large complex lobulated multi septated cystic mass which appears to arise from the undersurface of the left lobe of liver. This measures 9.5 by 12.0 by 12.5 cm. There may be a vascular pedicle communicating between this mass and the area around the falciform ligament. This is incompletely characterized without IV contrast. No  focal abnormalities noted within the right lobe of liver. The gallbladder appears normal. Mild increase caliber of the common bile duct which measures 1.2 cm. Pancreas: Unremarkable. No pancreatic ductal dilatation or surrounding inflammatory changes. Spleen: Normal in size without focal abnormality. Adrenals/Urinary Tract: The adrenal glands are normal. Unremarkable appearance of the kidneys. No mass or hydronephrosis. Urinary bladder appears normal. Stomach/Bowel: The stomach is normal. The small bowel loops have a normal course caliber. There is a mass involving the proximal ascending colon at the level of the ileocecal valve. This measures 3.8 x 4.0 by 3.6 cm, image 59 of series 2 and image 25 of series 5. No obstruction. Enteric contrast material is noted within the colon distal to this mass. Vascular/Lymphatic: Mild aortic atherosclerosis. No aneurysm. No adenopathy within the small bowel mesenteric. No pelvic or inguinal adenopathy. Reproductive: Calcified uterine fibroids noted.  No adnexal mass. Other: No free fluid or fluid collections identified within the abdomen or pelvis. Musculoskeletal: No aggressive lytic or sclerotic bone lesions. IMPRESSION: 1. There is a suspicious mass involving the proximal ascending colon at the level of the ileocecal valve. This is worrisome for a nonobstructing colonic neoplasm. Correlation with direct visualization is advised. 2. There is a large, complex multi loculated cystic mass which appears to arise from the undersurface of the left lobe of liver. 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 Further investigation with contrast enhanced liver protocol MRI is advised. 3. 3 mm right lower lobe lung nodule  is identified. No follow-up needed if patient is low-risk. Non-contrast chest CT can be considered in 12 months if patient is high-risk. This  recommendation follows the consensus statement: Guidelines for Management of Incidental Pulmonary Nodules Detected on CT Images: From the Fleischner Society 2017; Radiology 2017; 284:228-243. Electronically Signed   By: Kerby Moors M.D.   On: 04/01/2017 16:50   Dg Chest 2 View  Result Date: 03/31/2017 CLINICAL DATA:  Acute onset of cough, chest congestion and diarrhea. EXAM: CHEST  2 VIEW COMPARISON:  None. FINDINGS: AP semi-erect and lateral images were obtained. Cardiac silhouette normal in size for AP technique. Thoracic aorta mildly tortuous. Hilar and mediastinal contours otherwise unremarkable. Lungs clear. Bronchovascular markings normal. Pulmonary vascularity normal. No visible pleural effusions. No pneumothorax. Degenerative changes involving the thoracic and upper lumbar spine. IMPRESSION: No acute cardiopulmonary disease. Electronically Signed   By: Evangeline Dakin M.D.   On: 03/31/2017 17:50   Ct Head Wo Contrast  Result Date: 03/31/2017 CLINICAL DATA:  71 year old female felt dizzy on toilet. Diarrhea. Initial encounter. EXAM: CT HEAD WITHOUT CONTRAST TECHNIQUE: Contiguous axial images were obtained from the base of the skull through the vertex without intravenous contrast. COMPARISON:  None. FINDINGS: Brain: No intracranial hemorrhage or CT evidence of large acute infarct. Left frontal calvarium 11 mm calcified/ossified projection may represent focal area of hyperostosis frontalis interna or calcified meningioma. No surrounding vasogenic edema or significant mass effect. Vascular: Vascular calcifications Skull: No acute abnormality. Sinuses/Orbits: Visualized orbital structures unremarkable. Visualized paranasal sinuses clear. Other: Mastoid air cells and middle ear cavities clear. IMPRESSION: No acute intracranial abnormality. Specifically, no intracranial hemorrhage or CT evidence of large acute infarct. Left frontal calvarium 11 mm calcified/ossified projection may represent focal area  of hyperostosis frontalis interna or calcified meningioma. No surrounding vasogenic edema or significant mass effect. Electronically Signed   By: Genia Del M.D.   On: 03/31/2017 18:05   Mr Brain Wo Contrast  Result Date: 04/01/2017 CLINICAL DATA:  Vertigo.  Nausea and vomiting. EXAM: MRI HEAD WITHOUT CONTRAST TECHNIQUE: Multiplanar, multiecho pulse sequences of the brain and surrounding structures were obtained without intravenous contrast. COMPARISON:  03/31/2017 head CT FINDINGS: Brain: There is no evidence of acute infarct, intracranial hemorrhage, midline shift, or extra-axial fluid collection. Mild generalized cerebral atrophy is within normal limits for age. No significant cerebral white matter disease is seen. 1 cm extra-axial focus of T2 hypointensity and susceptibility along the inner table of the left frontal skull corresponds to the dense calcification/ossification on CT and may represent focal hyperostosis or meningioma. There is no associated mass effect or brain edema. Vascular: Major intracranial vascular flow voids are preserved. Skull and upper cervical spine: Unremarkable bone marrow signal. Sinuses/Orbits: Unremarkable orbits. Minimal right maxillary sinus mucosal thickening. Clear mastoid air cells. Other: None. IMPRESSION: 1. No acute intracranial abnormality. 2. 1 cm focal ossification or densely calcified meningioma along the left frontal skull. No mass effect or edema. Electronically Signed   By: Logan Bores M.D.   On: 04/01/2017 07:17    ROS Blood pressure (!) 147/74, pulse 79, temperature 98.4 F (36.9 C), temperature source Oral, resp. rate 18, height _0  (1.651 m), weight 84.2 kg (185 lb 10 oz), SpO2 100 %. 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 9 x 12 x 12.5 cystic mass arising from the undersurface of the left liver lobe. Gastritis Mild renal insufficiency - creatinine 1.74 Severe anemia transfused-  History  of GERD History of asthma FEN:  IV fluids/clear liquids ID: Rocephin 2 g on 04/01/2017 DVT: SCDs/severe anemia Foley: None Follow-up:  TBT  Plan: Patient has been seen and evaluated by Dr. Excell Seltzer.  If the cecal lesion was the only problem we can go ahead and do her surgery tomorrow.  Unfortunately this large cystic complex from the liver needs to be addressed at the same time.  Agree with MRI.  He will need to talk with Dr. Zella Richer and Dr. Barry Dienes about case and make a decision on how to proceed next week.    Will add CEA, and Ca 19-9.  We will follow with you.   Jasmine Cuevas 04/02/2017, 3:14 PM

## 2017-04-02 NOTE — Transfer of Care (Signed)
Immediate Anesthesia Transfer of Care Note  Patient: Jasmine Cuevas  Procedure(s) Performed: ESOPHAGOGASTRODUODENOSCOPY (EGD) WITH PROPOFOL (Left ) COLONOSCOPY WITH PROPOFOL (Left )  Patient Location: PACU and Endoscopy Unit  Anesthesia Type:MAC  Level of Consciousness: awake, alert  and oriented  Airway & Oxygen Therapy: Patient Spontanous Breathing and Patient connected to nasal cannula oxygen  Post-op Assessment: Report given to RN and Post -op Vital signs reviewed and stable  Post vital signs: stable  Last Vitals:  Vitals:   04/02/17 0603 04/02/17 1044  BP: 139/60 (!) 159/71  Pulse: 70 69  Resp: 18 18  Temp: 36.8 C 36.9 C  SpO2: 98% 97%    Last Pain:  Vitals:   04/02/17 1044  TempSrc: Oral         Complications: No apparent anesthesia complications

## 2017-04-02 NOTE — Progress Notes (Addendum)
PROGRESS NOTE    Jasmine Cuevas  GYI:[id number] DOB: July 28, 1945 DOA: 03/31/2017 PCP: System, Pcp Not In   Brief Narrative: (Start on day 1 of progress note - keep it brief and live) 71 yo F with hx of anemia, GERD presenting with fatigue, nausea, vomiting, vertigo found to have anemia and positive stool hemoccult.  Admitted for anemia, dizziness, N/V.    Found to have anemia to 5.8.  Colonoscopy with ulcerated friable proximal ascending colon mass.  Also with CT findings concerning for loculated cystic mas that appears to arise from undersurface of L lobe of liver.     Assessment & Plan:   Principal Problem:   Anemia Active Problems:   Dizziness   Malignant neoplasm of ascending colon (HCC)  Colon mass:  Likely malignant.  Seen on colonoscopy and CT imaging.  Colonoscopy with malignant tumor in proximal ascending colon.  Biopsies pending from colonoscopy.   Oncology, surgery consulted  Complex Multi Loculated Cystic Mass:  Seen on undersurface of L lobe liver on CT imaging.  Attempting to get MR, but pt unable to get contrast with renal function.  Will follow up kidney function again tomorrow and if still unable, plan for noncontrasted study.  Surgery, oncology c/s as above  Anemia: microcytic.  5.8 on presentation, improved to 8.7 after transfusion.  Positive hemoccult in ED.   Labs pending including iron panel, ferritin, folate, ESR, SPEP, upep, TSH, celiac panel Low normal iron, low ferritin, will start iron B12 normal.  Folate pending. Normal TSH. Celiac panel with negative TTG and gliadin anitbodies (reticulin antibody pendign) UPEP with normal urine immunofixation SPEP without evidence of monoclonal protein I don't see ESR ordered, but I don't think this will be helpful at this point. Likely 2/2 colonic mass above.  Biopsies pending from colonoscopy and endoscopy.  Nausea, vomiting, abdominal pain:  Suspect gastroenteritis as sx have resolved.  CT abdomen pelvis as above.      GI path panel and c diff negative.   Vertigo: notes hx of vertigo in past, sx improved with meclizine.  She has hearing loss, but this is chronic, maybe worse over last week, though vertigo coincided with nausea and vomiting.  Improved today.  Will have work with PT/OT.    MRI notable for meningioma. Echo with normal systolic function, moderate to severe LVH, and trace MR carotid dopplers with 1-39% stenosis bilaterally, antegrade flow in vertebral arteries.  Troponin negative x 3.   EEG noted in admission note, but has not been completed yet. I don't think this is indicated at this time, will hold off for now.  UTI F/u urine cx, greater than 100,000 klebsiella, f/u sensivities  Ceftriaxone.  AKI - unclear baseline, 1.88, improved today to 1.77. FeNA suggests intrinsic urine eos negative Continue IVF  NAGMA: likely 2/2 renal disease, will start bicarb  3 mm R lower lobe lung nodule  Thyroid nodules: seen incidentally on carotid US, outpatient f/u  No PCP, placed care management consult for PCP  DVT prophylaxis: SCD Code Status: Full  Family Communication: husband in room Disposition Plan: pending   Consultants:   GI, Dr. Paulita Fujita  Procedures: (Don't include imaging studies which can be auto populated. Include things that cannot be auto populated i.e. Echo, Carotid and venous dopplers, Foley, Bipap, HD, tubes/drains, wound vac, central lines etc)  Echo, carotid dopplers as above  Antimicrobials: (specify start and planned stop date. Auto populated tables are space occupying and do not give end dates)  ceftriaxone 10/24 -  Subjective: Feeling ok.  Nervous about today.   Objective: Vitals:   04/02/17 1044 04/02/17 1311 04/02/17 1320 04/02/17 1400  BP: (!) 159/71 (!) 138/50 (!) 126/53 (!) 147/74  Pulse: 69 88 77 79  Resp: '18 16 18 18  ' Temp: 98.4 F (36.9 C)   98.4 F (36.9 C)  TempSrc: Oral   Oral  SpO2: 97% 97% 100% 100%  Weight: 84.2 kg (185 lb 10 oz)       Height: '5\' 5"'  (1.651 m)       Intake/Output Summary (Last 24 hours) at 04/02/17 1858 Last data filed at 04/02/17 1655  Gross per 24 hour  Intake          1511.67 ml  Output              300 ml  Net          1211.67 ml   Filed Weights   04/01/17 0500 04/01/17 1500 04/02/17 1044  Weight: 88.5 kg (195 lb 1.7 oz) 84.2 kg (185 lb 10 oz) 84.2 kg (185 lb 10 oz)    Examination:  General: No acute distress.  Hard of hearing Cardiovascular: Heart sounds show Jasmine Cuevas regular rate, and rhythm. No gallops or rubs. No murmurs. No JVD. Lungs: Clear to auscultation bilaterally with good air movement. No rales, rhonchi or wheezes. Abdomen: Soft, nontender, nondistended with normal active bowel sounds. No masses. No hepatosplenomegaly. Neurological: Alert and oriented 3. Moves all extremities 4 with equal strength. Cranial nerves II through XII grossly intact. Skin: Warm and dry. No rashes or lesions. Extremities: No clubbing or cyanosis. No edema. Pedal pulses 2+. Psychiatric: Mood and affect are normal. Insight and judgment are appropriate.   Data Reviewed: I have personally reviewed following labs and imaging studies  CBC:  Recent Labs Lab 03/31/17 1707 04/01/17 0413 04/02/17 0346  WBC 9.3 7.2 6.8  NEUTROABS 7.6  --   --   HGB 5.8* 8.7* 8.8*  HCT 18.9* 27.0*  28.1* 27.4*  MCV 73.0* 78.3 79.0  PLT 398 369 562   Basic Metabolic Panel:  Recent Labs Lab 03/31/17 1707 04/01/17 0856 04/02/17 0346  NA 139 148* 142  K 3.8 3.9 3.8  CL 110 120* 116*  CO2 20* 20* 16*  GLUCOSE 181* 99 128*  BUN 31* 23* 15  CREATININE 1.88* 1.77* 1.74*  CALCIUM 8.9 9.3 8.9   GFR: Estimated Creatinine Clearance: 31.8 mL/min (Jasmine Cuevas) (by C-G formula based on SCr of 1.74 mg/dL (H)). Liver Function Tests:  Recent Labs Lab 03/31/17 1707 04/01/17 0856  AST 13* 15  ALT 9* 11*  ALKPHOS 73 70  BILITOT 0.4 0.4  PROT 6.3* 6.4*  ALBUMIN 3.3* 3.2*    Recent Labs Lab 03/31/17 2030  LIPASE 29   No  results for input(s): AMMONIA in the last 168 hours. Coagulation Profile:  Recent Labs Lab 03/31/17 1707  INR 1.04   Cardiac Enzymes:  Recent Labs Lab 04/01/17 0413 04/01/17 0856 04/01/17 1436  TROPONINI <0.03 <0.03 <0.03   BNP (last 3 results) No results for input(s): PROBNP in the last 8760 hours. HbA1C: No results for input(s): HGBA1C in the last 72 hours. CBG: No results for input(s): GLUCAP in the last 168 hours. Lipid Profile: No results for input(s): CHOL, HDL, LDLCALC, TRIG, CHOLHDL, LDLDIRECT in the last 72 hours. Thyroid Function Tests:  Recent Labs  04/01/17 0413  TSH 1.213   Anemia Panel:  Recent Labs  04/01/17 0413  VITAMINB12 665  FERRITIN 9*  TIBC  375  IRON 33   Sepsis Labs: No results for input(s): PROCALCITON, LATICACIDVEN in the last 168 hours.  Recent Results (from the past 240 hour(s))  Urine culture     Status: Abnormal (Preliminary result)   Collection Time: 03/31/17  9:07 PM  Result Value Ref Range Status   Specimen Description URINE, RANDOM  Final   Special Requests NONE  Final   Culture (Lindwood Mogel)  Final    >=100,000 COLONIES/mL KLEBSIELLA PNEUMONIAE SUSCEPTIBILITIES TO FOLLOW Performed at [facility], [address], [place], [place] [address]    Report Status PENDING  Incomplete  MRSA PCR Screening     Status: None   Collection Time: 03/31/17 10:14 PM  Result Value Ref Range Status   MRSA by PCR NEGATIVE NEGATIVE Final    Comment:        The GeneXpert MRSA Assay (FDA approved for NASAL specimens only), is one component of Rande Roylance comprehensive MRSA colonization surveillance program. It is not intended to diagnose MRSA infection nor to guide or monitor treatment for MRSA infections.   Gastrointestinal Panel by PCR , Stool     Status: None   Collection Time: 04/01/17  1:45 PM  Result Value Ref Range Status   Campylobacter species NOT DETECTED NOT DETECTED Final   Plesimonas shigelloides NOT DETECTED NOT DETECTED Final    Salmonella species NOT DETECTED NOT DETECTED Final   Yersinia enterocolitica NOT DETECTED NOT DETECTED Final   Vibrio species NOT DETECTED NOT DETECTED Final   Vibrio cholerae NOT DETECTED NOT DETECTED Final   Enteroaggregative E coli (EAEC) NOT DETECTED NOT DETECTED Final   Enteropathogenic E coli (EPEC) NOT DETECTED NOT DETECTED Final   Enterotoxigenic E coli (ETEC) NOT DETECTED NOT DETECTED Final   Shiga like toxin producing E coli (STEC) NOT DETECTED NOT DETECTED Final   Shigella/Enteroinvasive E coli (EIEC) NOT DETECTED NOT DETECTED Final   Cryptosporidium NOT DETECTED NOT DETECTED Final   Cyclospora cayetanensis NOT DETECTED NOT DETECTED Final   Entamoeba histolytica NOT DETECTED NOT DETECTED Final   Giardia lamblia NOT DETECTED NOT DETECTED Final   Adenovirus F40/41 NOT DETECTED NOT DETECTED Final   Astrovirus NOT DETECTED NOT DETECTED Final   Norovirus GI/GII NOT DETECTED NOT DETECTED Final   Rotavirus Cornella Emmer NOT DETECTED NOT DETECTED Final   Sapovirus (I, II, IV, and V) NOT DETECTED NOT DETECTED Final  C difficile quick scan w PCR reflex     Status: None   Collection Time: 04/01/17  1:45 PM  Result Value Ref Range Status   C Diff antigen NEGATIVE NEGATIVE Final   C Diff toxin NEGATIVE NEGATIVE Final   C Diff interpretation No C. difficile detected.  Final         Radiology Studies: Ct Abdomen Pelvis Wo Contrast  Result Date: 04/01/2017 CLINICAL DATA:  Abdominal pain.  Nausea and vomiting EXAM: CT ABDOMEN AND PELVIS WITHOUT CONTRAST TECHNIQUE: Multidetector CT imaging of the abdomen and pelvis was performed following the standard protocol without IV contrast. COMPARISON:  None FINDINGS: Lower chest: 3 mm right lower lobe lung nodule is identified, image number 19 of series 4. No acute abnormality identified. Hepatobiliary: There is Hazen Brumett large complex lobulated multi septated cystic mass which appears to arise from the undersurface of the left lobe of liver. This measures 9.5 by 12.0  by 12.5 cm. There may be Loula Marcella vascular pedicle communicating between this mass and the area around the falciform ligament. This is incompletely characterized without IV contrast. No focal abnormalities  noted within the right lobe of liver. The gallbladder appears normal. Mild increase caliber of the common bile duct which measures 1.2 cm. Pancreas: Unremarkable. No pancreatic ductal dilatation or surrounding inflammatory changes. Spleen: Normal in size without focal abnormality. Adrenals/Urinary Tract: The adrenal glands are normal. Unremarkable appearance of the kidneys. No mass or hydronephrosis. Urinary bladder appears normal. Stomach/Bowel: The stomach is normal. The small bowel loops have Deaun Rocha normal course caliber. There is Tabias Swayze mass involving the proximal ascending colon at the level of the ileocecal valve. This measures 3.8 x 4.0 by 3.6 cm, image 59 of series 2 and image 25 of series 5. No obstruction. Enteric contrast material is noted within the colon distal to this mass. Vascular/Lymphatic: Mild aortic atherosclerosis. No aneurysm. No adenopathy within the small bowel mesenteric. No pelvic or inguinal adenopathy. Reproductive: Calcified uterine fibroids noted.  No adnexal mass. Other: No free fluid or fluid collections identified within the abdomen or pelvis. Musculoskeletal: No aggressive lytic or sclerotic bone lesions. IMPRESSION: 1. There is Trygg Mantz suspicious mass involving the proximal ascending colon at the level of the ileocecal valve. This is worrisome for Maila Dukes nonobstructing colonic neoplasm. Correlation with direct visualization is advised. 2. There is Mireille Lacombe large, complex multi loculated cystic mass which appears to arise from the undersurface of the left lobe of liver. 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 The increased number of septations is Taylormarie Register finding which favors biliary cystadenocarcinoma. Further investigation with contrast enhanced liver protocol MRI is advised.  3. 3 mm right lower lobe lung nodule is identified. No follow-up needed if patient is low-risk. Non-contrast chest CT can be considered in 12 months if patient is high-risk. This recommendation follows the consensus statement: Guidelines for Management of Incidental Pulmonary Nodules Detected on CT Images: From the Fleischner Society 2017; Radiology 2017; 284:228-243. Electronically Signed   By: Kerby Moors M.D.   On: 04/01/2017 16:50   Mr Brain Wo Contrast  Result Date: 04/01/2017 CLINICAL DATA:  Vertigo.  Nausea and vomiting. EXAM: MRI HEAD WITHOUT CONTRAST TECHNIQUE: Multiplanar, multiecho pulse sequences of the brain and surrounding structures were obtained without intravenous contrast. COMPARISON:  03/31/2017 head CT FINDINGS: Brain: There is no evidence of acute infarct, intracranial hemorrhage, midline shift, or extra-axial fluid collection. Mild generalized cerebral atrophy is within normal limits for age. No significant cerebral white matter disease is seen. 1 cm extra-axial focus of T2 hypointensity and susceptibility along the inner table of the left frontal skull corresponds to the dense calcification/ossification on CT and may represent focal hyperostosis or meningioma. There is no associated mass effect or brain edema. Vascular: Major intracranial vascular flow voids are preserved. Skull and upper cervical spine: Unremarkable bone marrow signal. Sinuses/Orbits: Unremarkable orbits. Minimal right maxillary sinus mucosal thickening. Clear mastoid air cells. Other: None. IMPRESSION: 1. No acute intracranial abnormality. 2. 1 cm focal ossification or densely calcified meningioma along the left frontal skull. No mass effect or edema. Electronically Signed   By: Logan Bores M.D.   On: 04/01/2017 07:17        Scheduled Meds: . chlorhexidine  15 mL Mouth Rinse BID  . ferrous sulfate  325 mg Oral BID WC  . mouth rinse  15 mL Mouth Rinse q12n4p  . sodium bicarbonate  650 mg Oral TID    Continuous Infusions: . sodium chloride    . cefTRIAXone (ROCEPHIN)  IV Stopped (04/01/17 2029)  . lactated ringers       LOS: 2 days    Time  spent: over 30 minutes    Fayrene Helper, MD [facility] Pager [phone]  If 7PM-7AM, please contact night-coverage [email or web address] Password [facility] 04/02/2017, 6:58 PM

## 2017-04-03 ENCOUNTER — Encounter (HOSPITAL_COMMUNITY): Payer: Self-pay | Admitting: Gastroenterology

## 2017-04-03 ENCOUNTER — Inpatient Hospital Stay (HOSPITAL_COMMUNITY): Payer: Medicare Other

## 2017-04-03 DIAGNOSIS — I517 Cardiomegaly: Secondary | ICD-10-CM

## 2017-04-03 LAB — URINE CULTURE: Culture: >=100000 — AB

## 2017-04-03 LAB — CBC
HCT: 26.6 % — ABNORMAL LOW (ref 36.0–46.0)
Hemoglobin: 8.3 g/dL — ABNORMAL LOW (ref 12.0–15.0)
MCH: 24.9 pg — ABNORMAL LOW (ref 26.0–34.0)
MCHC: 31.2 g/dL (ref 30.0–36.0)
MCV: 79.6 fL (ref 78.0–100.0)
PLATELETS: 297 10*3/uL (ref 150–400)
RBC: 3.34 MIL/uL — ABNORMAL LOW (ref 3.87–5.11)
RDW: 17.9 % — AB (ref 11.5–15.5)
WBC: 5.1 10*3/uL (ref 4.0–10.5)

## 2017-04-03 LAB — SURGICAL PCR SCREEN
MRSA, PCR: NEGATIVE
STAPHYLOCOCCUS AUREUS: NEGATIVE

## 2017-04-03 LAB — BASIC METABOLIC PANEL
ANION GAP: 8 (ref 5–15)
BUN: 11 mg/dL (ref 6–20)
CALCIUM: 8.8 mg/dL — AB (ref 8.9–10.3)
CO2: 19 mmol/L — ABNORMAL LOW (ref 22–32)
CREATININE: 1.86 mg/dL — AB (ref 0.44–1.00)
Chloride: 115 mmol/L — ABNORMAL HIGH (ref 101–111)
GFR calc Af Amer: 30 mL/min — ABNORMAL LOW (ref >60)
GFR, EST NON AFRICAN AMERICAN: 26 mL/min — AB (ref >60)
GLUCOSE: 102 mg/dL — AB (ref 65–99)
Potassium: 4.1 mmol/L (ref 3.5–5.1)
Sodium: 142 mmol/L (ref 135–145)

## 2017-04-03 LAB — RETICULIN ANTIBODIES, IGA W TITER: Reticulin Ab, IgA: NEGATIVE titer (ref <2.5)

## 2017-04-03 LAB — FOLATE: Folate: 22.5 ng/mL (ref >5.9)

## 2017-04-03 MED ORDER — FAMOTIDINE 20 MG PO TABS
20.0000 mg | ORAL_TABLET | Freq: Two times a day (BID) | ORAL | Status: DC
  Administered 2017-04-03 – 2017-04-04 (×4): 20 mg via ORAL
  Filled 2017-04-03 (×4): qty 1

## 2017-04-03 MED ORDER — TRAZODONE HCL 50 MG PO TABS: 50.0000 mg | ORAL_TABLET | Freq: Every evening | ORAL | Status: DC | PRN

## 2017-04-03 MED ORDER — TRAZODONE HCL 50 MG PO TABS
50.0000 mg | ORAL_TABLET | Freq: Once | ORAL | Status: AC
Start: 2017-04-03 — End: 2017-04-03
  Administered 2017-04-03: 50 mg via ORAL
  Filled 2017-04-03: qty 1

## 2017-04-03 MED ORDER — CALCIUM CARBONATE ANTACID 500 MG PO CHEW
1.0000 | CHEWABLE_TABLET | Freq: Every day | ORAL | Status: DC | PRN
  Administered 2017-04-03: 200 mg via ORAL
  Filled 2017-04-03: qty 1

## 2017-04-03 NOTE — Progress Notes (Signed)
Subjective: No complaints.  Objective: Vital signs in last 24 hours: Temp:  [98.1 F (36.7 C)-98.4 F (36.9 C)] 98.2 F (36.8 C) (10/26 0429) Pulse Rate:  [67-88] 67 (10/26 0429) Resp:  [16-18] 18 (10/26 0429) BP: (113-159)/(46-74) 113/46 (10/26 0429) SpO2:  [97 %-100 %] 97 % (10/26 0429) Weight:  [185 lb 10 oz (84.2 kg)] 185 lb 10 oz (84.2 kg) (10/25 1044) Weight change: -0 oz (-0.001 kg) Last BM Date: 04/02/17  PE: GEN:  NAD  Lab Results: CBC    Component Value Date/Time   WBC 5.1 04/03/2017 0544   RBC 3.34 (L) 04/03/2017 0544   HGB 8.3 (L) 04/03/2017 0544   HCT 26.6 (L) 04/03/2017 0544   HCT 28.1 (L) 04/01/2017 0413   PLT 297 04/03/2017 0544   MCV 79.6 04/03/2017 0544   MCH 24.9 (L) 04/03/2017 0544   MCHC 31.2 04/03/2017 0544   RDW 17.9 (H) 04/03/2017 0544   LYMPHSABS 1.1 03/31/2017 1707   MONOABS 0.4 03/31/2017 1707   EOSABS 0.2 03/31/2017 1707   BASOSABS 0.0 03/31/2017 1707   CMP     Component Value Date/Time   NA 142 04/03/2017 0544   K 4.1 04/03/2017 0544   CL 115 (H) 04/03/2017 0544   CO2 19 (L) 04/03/2017 0544   GLUCOSE 102 (H) 04/03/2017 0544   BUN 11 04/03/2017 0544   CREATININE 1.86 (H) 04/03/2017 0544   CALCIUM 8.8 (L) 04/03/2017 0544   PROT 6.4 (L) 04/01/2017 0856   ALBUMIN 3.2 (L) 04/01/2017 0856   AST 15 04/01/2017 0856   ALT 11 (L) 04/01/2017 0856   ALKPHOS 70 04/01/2017 0856   BILITOT 0.4 04/01/2017 0856   GFRNONAA 26 (L) 04/03/2017 0544   GFRAA 30 (L) 04/03/2017 0544    Assessment:  1.  Anemia. 2.  Colon mass. 3.  Liver lesion, cystic process unrelated to colon mass favored (but metastases not definitively excluded).  Plan:  1.  MRI for further characterization of mass is planned today. 2.  Joint hepatic resection and colon resection anticipated by surgical team. 3.  Defer timing of surgery and diet to surgical team, pending their operative plans. 4.  [facility] will sign-off; please call with questions; thank you for the  consultation.   Landry Dyke 04/03/2017, 9:31 AM   Cell [phone] If no answer or after 5 PM call [phone]

## 2017-04-03 NOTE — Care Management Important Message (Signed)
Important Message  Patient Details  Name: Jasmine Cuevas MRN: [id number] Date of Birth: 1946-02-17   Medicare Important Message Given:  Yes    Kerin Salen 04/03/2017, 11:26 AMImportant Message  Patient Details  Name: Jasmine Cuevas MRN: [id number] Date of Birth: April 11, 1946   Medicare Important Message Given:  Yes    Kerin Salen 04/03/2017, 11:26 AM

## 2017-04-03 NOTE — Progress Notes (Signed)
PROGRESS NOTE    Jasmine Cuevas  HUD:[id number] DOB: January 19, 1946 DOA: 03/31/2017 PCP: System, Pcp Not In   Brief Narrative:  71 yo F with hx of anemia, GERD presenting with fatigue, nausea, vomiting, vertigo found to have anemia and positive stool hemoccult.  Admitted for anemia, dizziness, N/V.    Found to have anemia to 5.8.  Colonoscopy with ulcerated friable proximal ascending colon mass.  Also with CT findings concerning for loculated cystic mas that appears to arise from undersurface of L lobe of liver.    Assessment & Plan:   Principal Problem:   Anemia Active Problems:   Dizziness   Malignant neoplasm of ascending colon (HCC)  Colon mass:  Colonoscopy with large ulcerated and friable proximal ascending colon mass.  Likely malignant.  Seen on colonoscopy and CT imaging.  Colonoscopy with malignant tumor in proximal ascending colon.  Biopsies pending from colonoscopy.   Oncology, surgery consulted '[ ]'  follow up biopsy '[ ]'  CEA and CA 19 9 pending Per surgery, will likely need combined R hemicolectomy and resection of liver mass, awaiting results of MRI and planning to discuss with Dr. Barry Dienes.  RCRI likely 1 for intraperitoneal surgery.  Likely greater than 4 mets at baseline (no CP with exertion, walks up 2 flights of stairs to get to her apartment).   Complex Multi Loculated Cystic Mass:  Seen on undersurface of L lobe liver on CT imaging.  Surgery, oncology c/s as above.  MRI has been ordered as Arielis Leonhart noncontrasted study due to patients kidney disease.     Anemia: microcytic.  5.8 on presentation, improved to 8.7 after transfusion.  Slightly downtrending to 8.3 today.  She's noted clots in stool since colonoscopy.  Positive hemoccult in ED.   Labs pending including iron panel, ferritin, folate, ESR, SPEP, upep, TSH, celiac panel Low normal iron, low ferritin, will start iron B12 normal.  Folate pending. Normal TSH. Celiac panel with negative TTG and gliadin anitbodies (reticulin  antibody pendign) UPEP with normal urine immunofixation SPEP without evidence of monoclonal protein I don't see ESR ordered, but I don't think this will add much at this point Likely 2/2 colonic mass above.  Biopsies pending from colonoscopy and endoscopy.  Nausea, vomiting, abdominal pain:  Suspect gastroenteritis as sx have resolved.  CT abdomen pelvis as above.     GI path panel and c diff negative.   Vertigo: notes hx of vertigo in past, sx improved with meclizine.  She has hearing loss, but this is chronic, maybe worse over last week, though vertigo coincided with nausea and vomiting.  Improved today.  Will have work with PT/OT, pending evaluation. MRI notable for meningioma. Echo with normal systolic function, moderate to severe LVH, and trace MR carotid dopplers with 1-39% stenosis bilaterally, antegrade flow in vertebral arteries.  Troponin negative x 3.   EEG noted in admission note, but has not been completed yet. I don't think this is indicated at this time, will hold off for now.  UTI F/u urine cx, greater than 100,000 klebsiella, f/u sensivities  Ceftriaxone.  CKD stage III: creatinine has been stable around 1.7-1.8.  Likely with CKD, though no recent studies to compare prior to this hospitalization.   FeNA suggests intrinsic Urine without protein or RBC's urine eos negative Continue IVF  NAGMA: likely 2/2 renal disease, will start bicarb  Cough: she notes this is likely 2/2 her GERD.  Will start pepcid.   3 mm R lower lobe lung nodule  Thyroid nodules: seen  incidentally on carotid US, outpatient f/u  Moderate to severe LVH on echo  No PCP, placed care management consult for PCP  DVT prophylaxis: SCD Code Status: Full  Family Communication: husband in room Disposition Plan: pending   Consultants:   GI, Dr. Paulita Fujita  Procedures: (Don't include imaging studies which can be auto populated. Include things that cannot be auto populated i.e. Echo, Carotid and  venous dopplers, Foley, Bipap, HD, tubes/drains, wound vac, central lines etc)  Echo, carotid dopplers as above  Antimicrobials: (specify start and planned stop date. Auto populated tables are space occupying and do not give end dates)  ceftriaxone 10/24 -    Subjective: Feeling ok.   Objective: Vitals:   04/02/17 1320 04/02/17 1400 04/02/17 2045 04/03/17 0429  BP: (!) 126/53 (!) 147/74 (!) 133/52 (!) 113/46  Pulse: 77 79 75 67  Resp: '18 18 18 18  ' Temp:  98.4 F (36.9 C) 98.1 F (36.7 C) 98.2 F (36.8 C)  TempSrc:  Oral Oral Oral  SpO2: 100% 100% 99% 97%  Weight:      Height:        Intake/Output Summary (Last 24 hours) at 04/03/17 0831 Last data filed at 04/03/17 0300  Gross per 24 hour  Intake              570 ml  Output                0 ml  Net              570 ml   Filed Weights   04/01/17 0500 04/01/17 1500 04/02/17 1044  Weight: 88.5 kg (195 lb 1.7 oz) 84.2 kg (185 lb 10 oz) 84.2 kg (185 lb 10 oz)    Examination:  General: No acute distress.  Hard of hearing. Cardiovascular: Heart sounds show Ruchy Wildrick regular rate, and rhythm. No gallops or rubs. No murmurs. No JVD. Lungs: Clear to auscultation bilaterally with good air movement. No rales, rhonchi or wheezes. Abdomen: Soft, nontender, nondistended with normal active bowel sounds. No masses. No hepatosplenomegaly. Neurological: Alert and oriented 3. Moves all extremities 4 with equal strength. Cranial nerves II through XII grossly intact. Skin: Warm and dry. No rashes or lesions. Extremities: No clubbing or cyanosis. No edema. Pedal pulses 2+. Psychiatric: Mood and affect are normal. Insight and judgment are appropriate.  Data Reviewed: I have personally reviewed following labs and imaging studies  CBC:  Recent Labs Lab 03/31/17 1707 04/01/17 0413 04/02/17 0346 04/03/17 0544  WBC 9.3 7.2 6.8 5.1  NEUTROABS 7.6  --   --   --   HGB 5.8* 8.7* 8.8* 8.3*  HCT 18.9* 27.0*  28.1* 27.4* 26.6*  MCV 73.0* 78.3  79.0 79.6  PLT 398 369 354 812   Basic Metabolic Panel:  Recent Labs Lab 03/31/17 1707 04/01/17 0856 04/02/17 0346 04/03/17 0544  NA 139 148* 142 142  K 3.8 3.9 3.8 4.1  CL 110 120* 116* 115*  CO2 20* 20* 16* 19*  GLUCOSE 181* 99 128* 102*  BUN 31* 23* 15 11  CREATININE 1.88* 1.77* 1.74* 1.86*  CALCIUM 8.9 9.3 8.9 8.8*   GFR: Estimated Creatinine Clearance: 29.7 mL/min (Christon Parada) (by C-G formula based on SCr of 1.86 mg/dL (H)). Liver Function Tests:  Recent Labs Lab 03/31/17 1707 04/01/17 0856  AST 13* 15  ALT 9* 11*  ALKPHOS 73 70  BILITOT 0.4 0.4  PROT 6.3* 6.4*  ALBUMIN 3.3* 3.2*    Recent Labs Lab 03/31/17  2030  LIPASE 29   No results for input(s): AMMONIA in the last 168 hours. Coagulation Profile:  Recent Labs Lab 03/31/17 1707  INR 1.04   Cardiac Enzymes:  Recent Labs Lab 04/01/17 0413 04/01/17 0856 04/01/17 1436  TROPONINI <0.03 <0.03 <0.03   BNP (last 3 results) No results for input(s): PROBNP in the last 8760 hours. HbA1C: No results for input(s): HGBA1C in the last 72 hours. CBG: No results for input(s): GLUCAP in the last 168 hours. Lipid Profile: No results for input(s): CHOL, HDL, LDLCALC, TRIG, CHOLHDL, LDLDIRECT in the last 72 hours. Thyroid Function Tests:  Recent Labs  04/01/17 0413  TSH 1.213   Anemia Panel:  Recent Labs  04/01/17 0413 04/02/17 1958  VITAMINB12 665  --   FOLATE  --  22.5  FERRITIN 9*  --   TIBC 375  --   IRON 33  --    Sepsis Labs: No results for input(s): PROCALCITON, LATICACIDVEN in the last 168 hours.  Recent Results (from the past 240 hour(s))  Urine culture     Status: Abnormal (Preliminary result)   Collection Time: 03/31/17  9:07 PM  Result Value Ref Range Status   Specimen Description URINE, RANDOM  Final   Special Requests NONE  Final   Culture (Alaa Mullally)  Final    >=100,000 COLONIES/mL KLEBSIELLA PNEUMONIAE SUSCEPTIBILITIES TO FOLLOW Performed at [facility], [address],  [place], [place] [address]    Report Status PENDING  Incomplete  MRSA PCR Screening     Status: None   Collection Time: 03/31/17 10:14 PM  Result Value Ref Range Status   MRSA by PCR NEGATIVE NEGATIVE Final    Comment:        The GeneXpert MRSA Assay (FDA approved for NASAL specimens only), is one component of Hassel Uphoff comprehensive MRSA colonization surveillance program. It is not intended to diagnose MRSA infection nor to guide or monitor treatment for MRSA infections.   Gastrointestinal Panel by PCR , Stool     Status: None   Collection Time: 04/01/17  1:45 PM  Result Value Ref Range Status   Campylobacter species NOT DETECTED NOT DETECTED Final   Plesimonas shigelloides NOT DETECTED NOT DETECTED Final   Salmonella species NOT DETECTED NOT DETECTED Final   Yersinia enterocolitica NOT DETECTED NOT DETECTED Final   Vibrio species NOT DETECTED NOT DETECTED Final   Vibrio cholerae NOT DETECTED NOT DETECTED Final   Enteroaggregative E coli (EAEC) NOT DETECTED NOT DETECTED Final   Enteropathogenic E coli (EPEC) NOT DETECTED NOT DETECTED Final   Enterotoxigenic E coli (ETEC) NOT DETECTED NOT DETECTED Final   Shiga like toxin producing E coli (STEC) NOT DETECTED NOT DETECTED Final   Shigella/Enteroinvasive E coli (EIEC) NOT DETECTED NOT DETECTED Final   Cryptosporidium NOT DETECTED NOT DETECTED Final   Cyclospora cayetanensis NOT DETECTED NOT DETECTED Final   Entamoeba histolytica NOT DETECTED NOT DETECTED Final   Giardia lamblia NOT DETECTED NOT DETECTED Final   Adenovirus F40/41 NOT DETECTED NOT DETECTED Final   Astrovirus NOT DETECTED NOT DETECTED Final   Norovirus GI/GII NOT DETECTED NOT DETECTED Final   Rotavirus Theresa Wedel NOT DETECTED NOT DETECTED Final   Sapovirus (I, II, IV, and V) NOT DETECTED NOT DETECTED Final  C difficile quick scan w PCR reflex     Status: None   Collection Time: 04/01/17  1:45 PM  Result Value Ref Range Status   C Diff antigen NEGATIVE NEGATIVE Final   C Diff toxin  NEGATIVE NEGATIVE Final   C Diff interpretation No C. difficile detected.  Final         Radiology Studies: Ct Abdomen Pelvis Wo Contrast  Result Date: 04/01/2017 CLINICAL DATA:  Abdominal pain.  Nausea and vomiting EXAM: CT ABDOMEN AND PELVIS WITHOUT CONTRAST TECHNIQUE: Multidetector CT imaging of the abdomen and pelvis was performed following the standard protocol without IV contrast. COMPARISON:  None FINDINGS: Lower chest: 3 mm right lower lobe lung nodule is identified, image number 19 of series 4. No acute abnormality identified. Hepatobiliary: There is Kaid Seeberger large complex lobulated multi septated cystic mass which appears to arise from the undersurface of the left lobe of liver. This measures 9.5 by 12.0 by 12.5 cm. There may be Denece Shearer vascular pedicle communicating between this mass and the area around the falciform ligament. This is incompletely characterized without IV contrast. No focal abnormalities noted within the right lobe of liver. The gallbladder appears normal. Mild increase caliber of the Cuevas bile duct which measures 1.2 cm. Pancreas: Unremarkable. No pancreatic ductal dilatation or surrounding inflammatory changes. Spleen: Normal in size without focal abnormality. Adrenals/Urinary Tract: The adrenal glands are normal. Unremarkable appearance of the kidneys. No mass or hydronephrosis. Urinary bladder appears normal. Stomach/Bowel: The stomach is normal. The small bowel loops have Darcey Demma normal course caliber. There is Val Farnam mass involving the proximal ascending colon at the level of the ileocecal valve. This measures 3.8 x 4.0 by 3.6 cm, image 59 of series 2 and image 25 of series 5. No obstruction. Enteric contrast material is noted within the colon distal to this mass. Vascular/Lymphatic: Mild aortic atherosclerosis. No aneurysm. No adenopathy within the small bowel mesenteric. No pelvic or inguinal adenopathy. Reproductive: Calcified uterine fibroids noted.  No adnexal mass. Other: No free fluid  or fluid collections identified within the abdomen or pelvis. Musculoskeletal: No aggressive lytic or sclerotic bone lesions. IMPRESSION: 1. There is Giabella Duhart suspicious mass involving the proximal ascending colon at the level of the ileocecal valve. This is worrisome for Burlene Montecalvo nonobstructing colonic neoplasm. Correlation with direct visualization is advised. 2. There is Sherolyn Trettin large, complex multi loculated cystic mass which appears to arise from the undersurface of the left lobe of liver. 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 The increased number of septations is Raife Lizer finding which favors biliary cystadenocarcinoma. Further investigation with contrast enhanced liver protocol MRI is advised. 3. 3 mm right lower lobe lung nodule is identified. No follow-up needed if patient is low-risk. Non-contrast chest CT can be considered in 12 months if patient is high-risk. This recommendation follows the consensus statement: Guidelines for Management of Incidental Pulmonary Nodules Detected on CT Images: From the Fleischner Society 2017; Radiology 2017; 284:228-243. Electronically Signed   By: Kerby Moors M.D.   On: 04/01/2017 16:50        Scheduled Meds: . chlorhexidine  15 mL Mouth Rinse BID  . ferrous sulfate  325 mg Oral BID WC  . mouth rinse  15 mL Mouth Rinse q12n4p  . sodium bicarbonate  650 mg Oral TID   Continuous Infusions: . sodium chloride    . cefTRIAXone (ROCEPHIN)  IV Stopped (04/02/17 2149)  . lactated ringers 100 mL/hr at 04/03/17 0826     LOS: 3 days    Time spent: over 25 minutes    Fayrene Helper, MD [facility] Pager [phone]  If 7PM-7AM, please contact night-coverage [email or web address] Password [email or web address] 04/03/2017, 8:31 AM

## 2017-04-03 NOTE — Progress Notes (Signed)
OT Cancellation Note  Patient Details Name: Jasmine Cuevas MRN: [id number] DOB: [birth year]   Cancelled Treatment:    Reason Eval/Treat Not Completed: Patient at procedure or test/ unavailable  Kari Baars, OT [phone]  Payton Mccallum D 04/03/2017, 12:07 PM

## 2017-04-03 NOTE — Anesthesia Postprocedure Evaluation (Signed)
Anesthesia Post Note  Patient: Jasmine Cuevas  Procedure(s) Performed: ESOPHAGOGASTRODUODENOSCOPY (EGD) WITH PROPOFOL (Left ) COLONOSCOPY WITH PROPOFOL (Left )     Patient location during evaluation: PACU Anesthesia Type: MAC Level of consciousness: awake and alert Pain management: pain level controlled Vital Signs Assessment: post-procedure vital signs reviewed and stable Respiratory status: spontaneous breathing Cardiovascular status: stable Anesthetic complications: no    Last Vitals:  Vitals:   04/02/17 2045 04/03/17 0429  BP: (!) 133/52 (!) 113/46  Pulse: 75 67  Resp: 18 18  Temp: 36.7 C 36.8 C  SpO2: 99% 97%    Last Pain:  Vitals:   04/03/17 0429  TempSrc: Oral   Pain Goal:                 Nolon Nations

## 2017-04-03 NOTE — Progress Notes (Signed)
PT Cancellation Note  Patient Details Name: Delories Mauri MRN: [id number] DOB: 1945/12/16   Cancelled Treatment:    Reason Eval/Treat Not Completed: PT screened, no needs identified, will sign off. Spoke with pt and husband-both denied need for PT services. Will sign off at pt's request.    Weston Anna, MPT Pager: [phone]

## 2017-04-03 NOTE — Progress Notes (Signed)
[facility] Progress Note  1 Day Post-Op  Subjective: CC: diarrhea Patient is having loose BMs, non-bloody. Denies abdominal pain, distention, n/v. Tolerating CLD. Wanting to know what next steps are after MRI. Discussed that MRI will give more information and then there will be a discussion about what next steps should be. Husband present at bedside and very supportive. VSS.   Objective: Vital signs in last 24 hours: Temp:  [98.1 F (36.7 C)-98.4 F (36.9 C)] 98.2 F (36.8 C) (10/26 0429) Pulse Rate:  [67-88] 67 (10/26 0429) Resp:  [16-18] 18 (10/26 0429) BP: (113-159)/(46-74) 113/46 (10/26 0429) SpO2:  [97 %-100 %] 97 % (10/26 0429) Weight:  [84.2 kg (185 lb 10 oz)] 84.2 kg (185 lb 10 oz) (10/25 1044) Last BM Date: 04/02/17  Intake/Output from previous day: 10/25 0701 - 10/26 0700 In: 570 [I.V.:570] Out: -  Intake/Output this shift: Total I/O In: 480 [P.O.:480] Out: -   PE: Gen:  Alert, NAD, pleasant Card:  Regular rate and rhythm, pedal pulses 2+ BL Pulm:  Normal effort, clear to auscultation bilaterally Abd: Soft, non-tender, non-distended, bowel sounds present, no palpable mass Skin: warm and dry, no rashes  Psych: A&Ox3   Lab Results:   Recent Labs  04/02/17 0346 04/03/17 0544  WBC 6.8 5.1  HGB 8.8* 8.3*  HCT 27.4* 26.6*  PLT 354 297   BMET  Recent Labs  04/02/17 0346 04/03/17 0544  NA 142 142  K 3.8 4.1  CL 116* 115*  CO2 16* 19*  GLUCOSE 128* 102*  BUN 15 11  CREATININE 1.74* 1.86*  CALCIUM 8.9 8.8*   PT/INR  Recent Labs  03/31/17 1707  LABPROT 13.5  INR 1.04   CMP     Component Value Date/Time   NA 142 04/03/2017 0544   K 4.1 04/03/2017 0544   CL 115 (H) 04/03/2017 0544   CO2 19 (L) 04/03/2017 0544   GLUCOSE 102 (H) 04/03/2017 0544   BUN 11 04/03/2017 0544   CREATININE 1.86 (H) 04/03/2017 0544   CALCIUM 8.8 (L) 04/03/2017 0544   PROT 6.4 (L) 04/01/2017 0856   ALBUMIN 3.2 (L) 04/01/2017 0856   AST 15 04/01/2017  0856   ALT 11 (L) 04/01/2017 0856   ALKPHOS 70 04/01/2017 0856   BILITOT 0.4 04/01/2017 0856   GFRNONAA 26 (L) 04/03/2017 0544   GFRAA 30 (L) 04/03/2017 0544   Lipase     Component Value Date/Time   LIPASE 29 03/31/2017 2030       Studies/Results: Ct Abdomen Pelvis Wo Contrast  Result Date: 04/01/2017 CLINICAL DATA:  Abdominal pain.  Nausea and vomiting EXAM: CT ABDOMEN AND PELVIS WITHOUT CONTRAST TECHNIQUE: Multidetector CT imaging of the abdomen and pelvis was performed following the standard protocol without IV contrast. COMPARISON:  None FINDINGS: Lower chest: 3 mm right lower lobe lung nodule is identified, image number 19 of series 4. No acute abnormality identified. Hepatobiliary: There is a large complex lobulated multi septated cystic mass which appears to arise from the undersurface of the left lobe of liver. This measures 9.5 by 12.0 by 12.5 cm. There may be a vascular pedicle communicating between this mass and the area around the falciform ligament. This is incompletely characterized without IV contrast. No focal abnormalities noted within the right lobe of liver. The gallbladder appears normal. Mild increase caliber of the common bile duct which measures 1.2 cm. Pancreas: Unremarkable. No pancreatic ductal dilatation or surrounding inflammatory changes. Spleen: Normal in size without focal abnormality. Adrenals/Urinary  Tract: The adrenal glands are normal. Unremarkable appearance of the kidneys. No mass or hydronephrosis. Urinary bladder appears normal. Stomach/Bowel: The stomach is normal. The small bowel loops have a normal course caliber. There is a mass involving the proximal ascending colon at the level of the ileocecal valve. This measures 3.8 x 4.0 by 3.6 cm, image 59 of series 2 and image 25 of series 5. No obstruction. Enteric contrast material is noted within the colon distal to this mass. Vascular/Lymphatic: Mild aortic atherosclerosis. No aneurysm. No adenopathy within  the small bowel mesenteric. No pelvic or inguinal adenopathy. Reproductive: Calcified uterine fibroids noted.  No adnexal mass. Other: No free fluid or fluid collections identified within the abdomen or pelvis. Musculoskeletal: No aggressive lytic or sclerotic bone lesions. IMPRESSION: 1. There is a suspicious mass involving the proximal ascending colon at the level of the ileocecal valve. This is worrisome for a nonobstructing colonic neoplasm. Correlation with direct visualization is advised. 2. There is a large, complex multi loculated cystic mass which appears to arise from the undersurface of the left lobe of liver. 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 Further investigation with contrast enhanced liver protocol MRI is advised. 3. 3 mm right lower lobe lung nodule is identified. No follow-up needed if patient is low-risk. Non-contrast chest CT can be considered in 12 months if patient is high-risk. This recommendation follows the consensus statement: Guidelines for Management of Incidental Pulmonary Nodules Detected on CT Images: From the Fleischner Society 2017; Radiology 2017; 284:228-243. Electronically Signed   By: Kerby Moors M.D.   On: 04/01/2017 16:50    Anti-infectives: Anti-infectives    Start     Dose/Rate Route Frequency Ordered Stop   03/31/17 2000  cefTRIAXone (ROCEPHIN) 1 g in dextrose 5 % 50 mL IVPB     1 g 100 mL/hr over 30 Minutes Intravenous Every 24 hours 03/31/17 1957         Assessment/Plan Mild renal insufficiency GERD Hx of asthma UTI  Malignant tumor of ascending colon identified on colonoscopy 9 x12 x12.5 cystic mass left liver lobe - MRI for better characterization pending - CEA and CA 19-9 pending ?Gastroenteritis - UGI 10/25 granular mucosa - still having some diarrhea - IVF and supportive care - GI panel and C.Diff  negative Severe anemia - Hgb 8.3, s/p 2U PRBC; VSS  FEN: CLD VTE: SCDs ID: ceftriaxone (10/23>>) Foley: none Follow up: TBD  Plan: MRI pending. Plan to discuss with Dr. Zella Richer and Dr. Barry Dienes to come up with surgical plan.   LOS: 3 days    Brigid Re , [facility] 04/03/2017, 10:10 AM Pager: [phone] Consults: [phone] Mon-Fri 7:00 am-4:30 pm Sat-Sun 7:00 am-11:30 am

## 2017-04-04 DIAGNOSIS — N3090 Cystitis, unspecified without hematuria: Secondary | ICD-10-CM

## 2017-04-04 DIAGNOSIS — R05 Cough: Secondary | ICD-10-CM

## 2017-04-04 LAB — BASIC METABOLIC PANEL
Anion gap: 7 (ref 5–15)
BUN: 9 mg/dL (ref 6–20)
CALCIUM: 8.8 mg/dL — AB (ref 8.9–10.3)
CO2: 21 mmol/L — AB (ref 22–32)
CREATININE: 1.86 mg/dL — AB (ref 0.44–1.00)
Chloride: 114 mmol/L — ABNORMAL HIGH (ref 101–111)
GFR calc Af Amer: 30 mL/min — ABNORMAL LOW (ref >60)
GFR calc non Af Amer: 26 mL/min — ABNORMAL LOW (ref >60)
GLUCOSE: 96 mg/dL (ref 65–99)
Potassium: 4.1 mmol/L (ref 3.5–5.1)
Sodium: 142 mmol/L (ref 135–145)

## 2017-04-04 LAB — CBC
HCT: 25.9 % — ABNORMAL LOW (ref 36.0–46.0)
Hemoglobin: 8.1 g/dL — ABNORMAL LOW (ref 12.0–15.0)
MCH: 24.8 pg — ABNORMAL LOW (ref 26.0–34.0)
MCHC: 31.3 g/dL (ref 30.0–36.0)
MCV: 79.4 fL (ref 78.0–100.0)
Platelets: 296 10*3/uL (ref 150–400)
RBC: 3.26 MIL/uL — ABNORMAL LOW (ref 3.87–5.11)
RDW: 18.3 % — AB (ref 11.5–15.5)
WBC: 4.8 10*3/uL (ref 4.0–10.5)

## 2017-04-04 LAB — CANCER ANTIGEN 19-9: CA 19-9: 43 U/mL — ABNORMAL HIGH (ref 0–35)

## 2017-04-04 LAB — CEA: CEA: 4.8 ng/mL — ABNORMAL HIGH (ref 0.0–4.7)

## 2017-04-04 MED ORDER — SODIUM BICARBONATE 650 MG PO TABS
650.0000 mg | ORAL_TABLET | Freq: Two times a day (BID) | ORAL | Status: DC
  Administered 2017-04-04 – 2017-04-05 (×3): 650 mg via ORAL
  Filled 2017-04-04 (×3): qty 1

## 2017-04-04 MED ORDER — BENZONATATE 100 MG PO CAPS
100.0000 mg | ORAL_CAPSULE | Freq: Three times a day (TID) | ORAL | Status: DC | PRN
  Administered 2017-04-04 – 2017-04-06 (×4): 100 mg via ORAL
  Filled 2017-04-04 (×4): qty 1

## 2017-04-04 NOTE — Progress Notes (Signed)
OT Cancellation Note  Patient Details Name: Jasmine Cuevas MRN: [id number] DOB: 06/26/1945   Cancelled Treatment:    Reason Eval/Treat Not Completed: Other (comment). In to see pt, knocked and entered--pt sleeping soundly and so was family in the room. Spoke to RN suggests not to wake pt, that she has this new diagnosis of mass and has had a lot of visitors. Will re-attempt eval at later date.  Almon Register [phone] 04/04/2017, 3:06 PM

## 2017-04-04 NOTE — Progress Notes (Signed)
General Surgery [facility]  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 Zella Richer.  Continue clear liquid diet.        Earnstine Regal, MD, [facility]       Office: [phone]    Chief Complaint: Right colon neoplasm, cystic neoplasm of abdomen  Subjective: Patient in bed, comfortable, no family in room.  Objective: Vital signs in last 24 hours: Temp:  [97.1 F (36.2 C)-98.2 F (36.8 C)] 98.2 F (36.8 C) (10/27 0537) Pulse Rate:  [69-70] 69 (10/27 0537) Resp:  [16-17] 17 (10/27 0537) BP: (127-144)/(57-64) 127/57 (10/27 0537) SpO2:  [95 %-99 %] 95 % (10/27 0537) Weight:  [83 kg (183 lb)] 83 kg (183 lb) (10/27 0537) Last BM Date: 04/03/17  Intake/Output from previous day: 10/26 0701 - 10/27 0700 In: 1383.3 [P.O.:480; I.V.:903.3] Out: -  Intake/Output this shift: Total I/O In: 360 [P.O.:360] Out: -   Physical Exam: HEENT - sclerae clear, mucous membranes moist Neck - soft Chest - clear bilaterally Cor - RRR Abdomen - soft, non-tender; BS present Ext - no edema, non-tender Neuro - alert & oriented, no focal deficits  Lab Results:   Recent Labs  04/03/17 0544 04/04/17 0610  WBC 5.1 4.8  HGB 8.3* 8.1*  HCT 26.6* 25.9*  PLT 297 296   BMET  Recent Labs  04/03/17 0544 04/04/17 0610  NA 142 142  K 4.1 4.1  CL 115* 114*  CO2 19* 21*  GLUCOSE 102* 96  BUN 11 9  CREATININE 1.86* 1.86*  CALCIUM 8.8* 8.8*    PT/INR No results for input(s): LABPROT, INR in the last 72 hours. Comprehensive Metabolic Panel:    Component Value Date/Time   NA 142 04/04/2017 0610   NA 142 04/03/2017 0544   K 4.1 04/04/2017 0610   K 4.1 04/03/2017 0544   CL 114 (H) 04/04/2017 0610   CL 115 (H) 04/03/2017 0544   CO2 21 (L) 04/04/2017 0610   CO2 19 (L) 04/03/2017 0544   BUN 9 04/04/2017 0610   BUN 11 04/03/2017 0544   CREATININE 1.86 (H) 04/04/2017 0610   CREATININE 1.86 (H) 04/03/2017 0544   GLUCOSE 96 04/04/2017 0610   GLUCOSE 102 (H) 04/03/2017 0544   CALCIUM 8.8 (L) 04/04/2017 0610   CALCIUM 8.8 (L) 04/03/2017 0544   AST 15 04/01/2017 0856   AST 13 (L) 03/31/2017 1707   ALT 11 (L) 04/01/2017 0856   ALT 9 (L) 03/31/2017 1707   ALKPHOS 70 04/01/2017 0856   ALKPHOS 73 03/31/2017 1707   BILITOT 0.4 04/01/2017 0856   BILITOT 0.4 03/31/2017 1707   PROT 6.4 (L) 04/01/2017 0856   PROT 6.3 (L) 03/31/2017 1707   ALBUMIN 3.2 (L) 04/01/2017 0856   ALBUMIN 3.3 (L) 03/31/2017 1707    Studies/Results: Mr Abdomen Wo Contrast  Result Date: 04/03/2017 CLINICAL DATA:  Complex cystic right upper quadrant mass on recent CT. Newly diagnosed colon carcinoma. Renal insufficiency. EXAM: MRI ABDOMEN  WITHOUT CONTRAST TECHNIQUE: Multiplanar multisequence MR imaging was performed without the administration of intravenous contrast. COMPARISON:  Noncontrast CT on 04/01/2017 FINDINGS: Lower chest: No acute findings. Hepatobiliary: Image degradation by motion artifact noted. A few tiny sub-cm cysts are noted in the left hepatic lobe. No definite intrahepatic masses are seen on this unenhanced exam. Pancreas: Limited evaluation due to respiratory motion artifact. No definite pancreatic mass identified. Spleen:  Within normal limits in size. Adrenals/Urinary tract: Bilateral renal parenchymal atrophy and numerous tiny sub-cm renal cysts, consistent with medical renal disease. No definite renal mass identified on this unenhanced exam.  No evidence of hydronephrosis. Stomach/Bowel: No evidence of obstruction, inflammatory process, or abnormal fluid collections. Vascular/Lymphatic: No pathologically enlarged lymph nodes identified. No evidence of abdominal aortic aneurysm. Other: A large complex cystic lesion is seen in the right upper quadrant which measures 13.1 x 9.2 cm. This contains multiple thickened internal septations, at least 1 with soft tissue nodularity. This abuts the inferior surface the liver but does not appear to arise within the liver or other adjacent organs. No evidence of ascites. Musculoskeletal:  No suspicious bone lesions identified. IMPRESSION: Image degradation by motion artifact noted. 13 cm complex cystic mass in right upper quadrant which abuts the inferior surface of the liver, but does not appear to arise from within the liver. Differential diagnosis includes metastatic mucinous adenocarcinoma, primary peritoneal neoplasm, and atypical biliary cystadenoma/cystadenocarcinoma. Surgical removal should be considered. No other masses or sites of metastatic disease identified within the abdomen. Electronically Signed   By: Earle Gell M.D.   On: 04/03/2017 12:56      Nuevo M 04/04/2017  Patient ID: Jasmine Cuevas, female   DOB: [birth year], 71 y.o.   MRN: [id number]

## 2017-04-04 NOTE — Progress Notes (Signed)
Attempted to start IV. Patent complained that it was too painful. Family at bedside complained that this is "ridiculous". Informed patient's nurse of the event.

## 2017-04-04 NOTE — Progress Notes (Signed)
PROGRESS NOTE    Jozlyn Schatz  AVW:[id number] DOB: 06/18/1945 DOA: 03/31/2017 PCP: System, Pcp Not In   Brief Narrative:  71 yo F with hx of anemia, GERD presenting with fatigue, nausea, vomiting, vertigo found to have anemia and positive stool hemoccult.  Admitted for anemia, dizziness, N/V.    Found to have anemia to 5.8.  Colonoscopy with ulcerated friable proximal ascending colon mass.  Also with CT findings concerning for loculated cystic mas that appears to arise from undersurface of L lobe of liver.    Assessment & Plan:   Principal Problem:   Anemia Active Problems:   Dizziness   Malignant neoplasm of ascending colon (HCC)  Colon mass:  Colonoscopy with large ulcerated and friable proximal ascending colon mass.  Likely malignant.  Seen on colonoscopy and CT imaging.  Colonoscopy with malignant tumor in proximal ascending colon.  Biopsies pending from colonoscopy.   Oncology, surgery consulted '[ ]'  follow up biopsy '[ ]'  CEA (4.8) and CA 19 9 (43)  Per surgery, tentatively planning open laparotomy, R colectomy, and resection of cystic neoplasm on Monday, 10/29 RCRI likely 1 for intraperitoneal surgery.  Likely greater than 4 mets at baseline (no CP with exertion, walks up 2 flights of stairs to get to her apartment).   Complex Multi Loculated Cystic Mass:  Seen on undersurface of L lobe liver on CT imaging.  Surgery, oncology c/s as above.  MRI with 13 cm complex cystic mass which abuts inferior surface of liver, but does not appear to arise from within liver.   Plan for surgery as noted above.     Anemia: microcytic.  5.8 on presentation, improved to 8.7 after transfusion.  Slightly downtrending to 8.1 today.  She's noted clots in stool since colonoscopy, this morning noted dark stool (suspect this may be bc she's been started on iron, will ctm closely).  Positive hemoccult in ED.   Labs pending including iron panel, ferritin, folate, ESR, SPEP, upep, TSH, celiac panel Low  normal iron, low ferritin, will start iron B12 normal.  Folate pending. Normal TSH. Celiac panel with negative TTG and gliadin anitbodies (reticulin antibody pendign) UPEP with normal urine immunofixation SPEP without evidence of monoclonal protein I don't see ESR ordered, but I don't think this will add much at this point Likely 2/2 colonic mass above.  Biopsies pending from colonoscopy and endoscopy.  CTM H/H.    Nausea, vomiting, abdominal pain:  Suspect gastroenteritis as sx have resolved.  CT abdomen pelvis as above.     GI path panel and c diff negative.   Vertigo: notes hx of vertigo in past, sx improved with meclizine.  She has hearing loss, but this is chronic, maybe worse over last week, though vertigo coincided with nausea and vomiting.  Improved today.  Will have work with PT/OT, pending evaluation. MRI notable for meningioma. Echo with normal systolic function, moderate to severe LVH, and trace MR carotid dopplers with 1-39% stenosis bilaterally, antegrade flow in vertebral arteries.  Troponin negative x 3.   EEG noted in admission note, but has not been completed yet. I don't think this is indicated at this time, will hold off for now.  UTI F/u urine cx, greater than 100,000 klebsiella, f/u sensivities  Ceftriaxone 10/24 - 10/28  CKD stage III: creatinine has been stable around 1.7-1.8.  Likely with CKD, though no recent studies to compare prior to this hospitalization.   FeNA suggests intrinsic Urine without protein or RBC's urine eos negative Continue IVF  NAGMA: likely 2/2 renal disease, will start bicarb  Cough: she notes this is likely 2/2 her GERD.  Will start pepcid. Tessalon perles.   3 mm R lower lobe lung nodule  Thyroid nodules: seen incidentally on carotid US, outpatient f/u  Moderate to severe LVH on echo  No PCP, placed care management consult for PCP  DVT prophylaxis: SCD Code Status: Full  Family Communication: husband in room Disposition  Plan: pending   Consultants:   GI, Dr. Paulita Fujita  Procedures: (Don't include imaging studies which can be auto populated. Include things that cannot be auto populated i.e. Echo, Carotid and venous dopplers, Foley, Bipap, HD, tubes/drains, wound vac, central lines etc)  Echo, carotid dopplers as above  Antimicrobials: (specify start and planned stop date. Auto populated tables are space occupying and do not give end dates)  ceftriaxone 10/24 - 28   Subjective: Cough has been bothering her.   Concerned about dark stool this morning.   No LH, dizziness, abdominal pain.    Objective: Vitals:   04/03/17 1404 04/03/17 2035 04/04/17 0537 04/04/17 1346  BP: 136/64 (!) 144/64 (!) 127/57 (!) 144/63  Pulse: 69 70 69 66  Resp: '16 16 17 20  ' Temp: (!) 97.1 F (36.2 C) 97.9 F (36.6 C) 98.2 F (36.8 C) 98.3 F (36.8 C)  TempSrc: Axillary Oral Oral Oral  SpO2: 99% 99% 95% 100%  Weight:   83 kg (183 lb)   Height:        Intake/Output Summary (Last 24 hours) at 04/04/17 1408 Last data filed at 04/04/17 1300  Gross per 24 hour  Intake             1070 ml  Output                0 ml  Net             1070 ml   Filed Weights   04/01/17 1500 04/02/17 1044 04/04/17 0537  Weight: 84.2 kg (185 lb 10 oz) 84.2 kg (185 lb 10 oz) 83 kg (183 lb)    Examination:  General: No acute distress. Cardiovascular: Heart sounds show Magdalyn Arenivas regular rate, and rhythm. No gallops or rubs. No murmurs. No JVD. Lungs: Clear to auscultation bilaterally with good air movement. No rales, rhonchi or wheezes. Abdomen: Soft, nontender, nondistended with normal active bowel sounds. No masses. No hepatosplenomegaly. Neurological: Alert and oriented 3. Moves all extremities 4 with equal strength. Cranial nerves II through XII grossly intact. Skin: Warm and dry. No rashes or lesions. Extremities: No clubbing or cyanosis. No edema.  Psychiatric: Mood and affect are normal. Insight and judgment are appropriate.  Data  Reviewed: I have personally reviewed following labs and imaging studies  CBC:  Recent Labs Lab 03/31/17 1707 04/01/17 0413 04/02/17 0346 04/03/17 0544 04/04/17 0610  WBC 9.3 7.2 6.8 5.1 4.8  NEUTROABS 7.6  --   --   --   --   HGB 5.8* 8.7* 8.8* 8.3* 8.1*  HCT 18.9* 27.0*  28.1* 27.4* 26.6* 25.9*  MCV 73.0* 78.3 79.0 79.6 79.4  PLT 398 369 354 297 001   Basic Metabolic Panel:  Recent Labs Lab 03/31/17 1707 04/01/17 0856 04/02/17 0346 04/03/17 0544 04/04/17 0610  NA 139 148* 142 142 142  K 3.8 3.9 3.8 4.1 4.1  CL 110 120* 116* 115* 114*  CO2 20* 20* 16* 19* 21*  GLUCOSE 181* 99 128* 102* 96  BUN 31* 23* '15 11 9  ' CREATININE 1.88*  1.77* 1.74* 1.86* 1.86*  CALCIUM 8.9 9.3 8.9 8.8* 8.8*   GFR: Estimated Creatinine Clearance: 29.5 mL/min (Hera Celaya) (by C-G formula based on SCr of 1.86 mg/dL (H)). Liver Function Tests:  Recent Labs Lab 03/31/17 1707 04/01/17 0856  AST 13* 15  ALT 9* 11*  ALKPHOS 73 70  BILITOT 0.4 0.4  PROT 6.3* 6.4*  ALBUMIN 3.3* 3.2*    Recent Labs Lab 03/31/17 2030  LIPASE 29   No results for input(s): AMMONIA in the last 168 hours. Coagulation Profile:  Recent Labs Lab 03/31/17 1707  INR 1.04   Cardiac Enzymes:  Recent Labs Lab 04/01/17 0413 04/01/17 0856 04/01/17 1436  TROPONINI <0.03 <0.03 <0.03   BNP (last 3 results) No results for input(s): PROBNP in the last 8760 hours. HbA1C: No results for input(s): HGBA1C in the last 72 hours. CBG: No results for input(s): GLUCAP in the last 168 hours. Lipid Profile: No results for input(s): CHOL, HDL, LDLCALC, TRIG, CHOLHDL, LDLDIRECT in the last 72 hours. Thyroid Function Tests: No results for input(s): TSH, T4TOTAL, FREET4, T3FREE, THYROIDAB in the last 72 hours. Anemia Panel:  Recent Labs  04/02/17 1958  FOLATE 22.5   Sepsis Labs: No results for input(s): PROCALCITON, LATICACIDVEN in the last 168 hours.  Recent Results (from the past 240 hour(s))  Urine culture      Status: Abnormal   Collection Time: 03/31/17  9:07 PM  Result Value Ref Range Status   Specimen Description URINE, RANDOM  Final   Special Requests NONE  Final   Culture >=100,000 COLONIES/mL KLEBSIELLA PNEUMONIAE (Susie Ehresman)  Final   Report Status 04/03/2017 FINAL  Final   Organism ID, Bacteria KLEBSIELLA PNEUMONIAE (Laikyn Gewirtz)  Final      Susceptibility   Klebsiella pneumoniae - MIC*    AMPICILLIN RESISTANT Resistant     CEFAZOLIN <=4 SENSITIVE Sensitive     CEFTRIAXONE <=1 SENSITIVE Sensitive     CIPROFLOXACIN <=0.25 SENSITIVE Sensitive     GENTAMICIN <=1 SENSITIVE Sensitive     IMIPENEM 0.5 SENSITIVE Sensitive     NITROFURANTOIN <=16 SENSITIVE Sensitive     TRIMETH/SULFA <=20 SENSITIVE Sensitive     AMPICILLIN/SULBACTAM <=2 SENSITIVE Sensitive     PIP/TAZO <=4 SENSITIVE Sensitive     Extended ESBL NEGATIVE Sensitive     * >=100,000 COLONIES/mL KLEBSIELLA PNEUMONIAE  MRSA PCR Screening     Status: None   Collection Time: 03/31/17 10:14 PM  Result Value Ref Range Status   MRSA by PCR NEGATIVE NEGATIVE Final    Comment:        The GeneXpert MRSA Assay (FDA approved for NASAL specimens only), is one component of Alaynah Schutter comprehensive MRSA colonization surveillance program. It is not intended to diagnose MRSA infection nor to guide or monitor treatment for MRSA infections.   Gastrointestinal Panel by PCR , Stool     Status: None   Collection Time: 04/01/17  1:45 PM  Result Value Ref Range Status   Campylobacter species NOT DETECTED NOT DETECTED Final   Plesimonas shigelloides NOT DETECTED NOT DETECTED Final   Salmonella species NOT DETECTED NOT DETECTED Final   Yersinia enterocolitica NOT DETECTED NOT DETECTED Final   Vibrio species NOT DETECTED NOT DETECTED Final   Vibrio cholerae NOT DETECTED NOT DETECTED Final   Enteroaggregative E coli (EAEC) NOT DETECTED NOT DETECTED Final   Enteropathogenic E coli (EPEC) NOT DETECTED NOT DETECTED Final   Enterotoxigenic E coli (ETEC) NOT DETECTED NOT  DETECTED Final   Shiga like toxin producing E  coli (STEC) NOT DETECTED NOT DETECTED Final   Shigella/Enteroinvasive E coli (EIEC) NOT DETECTED NOT DETECTED Final   Cryptosporidium NOT DETECTED NOT DETECTED Final   Cyclospora cayetanensis NOT DETECTED NOT DETECTED Final   Entamoeba histolytica NOT DETECTED NOT DETECTED Final   Giardia lamblia NOT DETECTED NOT DETECTED Final   Adenovirus F40/41 NOT DETECTED NOT DETECTED Final   Astrovirus NOT DETECTED NOT DETECTED Final   Norovirus GI/GII NOT DETECTED NOT DETECTED Final   Rotavirus Jamontae Thwaites NOT DETECTED NOT DETECTED Final   Sapovirus (I, II, IV, and V) NOT DETECTED NOT DETECTED Final  C difficile quick scan w PCR reflex     Status: None   Collection Time: 04/01/17  1:45 PM  Result Value Ref Range Status   C Diff antigen NEGATIVE NEGATIVE Final   C Diff toxin NEGATIVE NEGATIVE Final   C Diff interpretation No C. difficile detected.  Final  Surgical pcr screen     Status: None   Collection Time: 04/03/17  4:43 PM  Result Value Ref Range Status   MRSA, PCR NEGATIVE NEGATIVE Final   Staphylococcus aureus NEGATIVE NEGATIVE Final    Comment: (NOTE) The Xpert SA Assay (FDA approved for NASAL specimens in patients 44 years of age and older), is one component of Venessa Wickham comprehensive surveillance program. It is not intended to diagnose infection nor to guide or monitor treatment.          Radiology Studies: Mr Abdomen Wo Contrast  Result Date: 04/03/2017 CLINICAL DATA:  Complex cystic right upper quadrant mass on recent CT. Newly diagnosed colon carcinoma. Renal insufficiency. EXAM: MRI ABDOMEN WITHOUT CONTRAST TECHNIQUE: Multiplanar multisequence MR imaging was performed without the administration of intravenous contrast. COMPARISON:  Noncontrast CT on 04/01/2017 FINDINGS: Lower chest: No acute findings. Hepatobiliary: Image degradation by motion artifact noted. Berdell Nevitt few tiny sub-cm cysts are noted in the left hepatic lobe. No definite intrahepatic  masses are seen on this unenhanced exam. Pancreas: Limited evaluation due to respiratory motion artifact. No definite pancreatic mass identified. Spleen:  Within normal limits in size. Adrenals/Urinary tract: Bilateral renal parenchymal atrophy and numerous tiny sub-cm renal cysts, consistent with medical renal disease. No definite renal mass identified on this unenhanced exam. No evidence of hydronephrosis. Stomach/Bowel: No evidence of obstruction, inflammatory process, or abnormal fluid collections. Vascular/Lymphatic: No pathologically enlarged lymph nodes identified. No evidence of abdominal aortic aneurysm. Other: Gwenlyn Hottinger large complex cystic lesion is seen in the right upper quadrant which measures 13.1 x 9.2 cm. This contains multiple thickened internal septations, at least 1 with soft tissue nodularity. This abuts the inferior surface the liver but does not appear to arise within the liver or other adjacent organs. No evidence of ascites. Musculoskeletal:  No suspicious bone lesions identified. IMPRESSION: Image degradation by motion artifact noted. 13 cm complex cystic mass in right upper quadrant which abuts the inferior surface of the liver, but does not appear to arise from within the liver. Differential diagnosis includes metastatic mucinous adenocarcinoma, primary peritoneal neoplasm, and atypical biliary cystadenoma/cystadenocarcinoma. Surgical removal should be considered. No other masses or sites of metastatic disease identified within the abdomen. Electronically Signed   By: Earle Gell M.D.   On: 04/03/2017 12:56        Scheduled Meds: . chlorhexidine  15 mL Mouth Rinse BID  . famotidine  20 mg Oral BID  . ferrous sulfate  325 mg Oral BID WC  . mouth rinse  15 mL Mouth Rinse q12n4p  . sodium bicarbonate  650  mg Oral TID   Continuous Infusions: . sodium chloride    . cefTRIAXone (ROCEPHIN)  IV Stopped (04/03/17 2033)     LOS: 4 days    Time spent: over 25 minutes    Fayrene Helper, MD [facility] Pager [phone]  If 7PM-7AM, please contact night-coverage [email or web address] Password [email or web address] 04/04/2017, 2:08 PM

## 2017-04-05 DIAGNOSIS — E872 Acidosis, unspecified: Secondary | ICD-10-CM

## 2017-04-05 DIAGNOSIS — I517 Cardiomegaly: Secondary | ICD-10-CM

## 2017-04-05 DIAGNOSIS — H9190 Unspecified hearing loss, unspecified ear: Secondary | ICD-10-CM

## 2017-04-05 DIAGNOSIS — K219 Gastro-esophageal reflux disease without esophagitis: Secondary | ICD-10-CM

## 2017-04-05 DIAGNOSIS — H918X2 Other specified hearing loss, left ear: Secondary | ICD-10-CM

## 2017-04-05 DIAGNOSIS — N390 Urinary tract infection, site not specified: Secondary | ICD-10-CM

## 2017-04-05 DIAGNOSIS — R05 Cough: Secondary | ICD-10-CM

## 2017-04-05 DIAGNOSIS — N183 Chronic kidney disease, stage 3 unspecified: Secondary | ICD-10-CM

## 2017-04-05 DIAGNOSIS — R059 Cough, unspecified: Secondary | ICD-10-CM

## 2017-04-05 DIAGNOSIS — E041 Nontoxic single thyroid nodule: Secondary | ICD-10-CM

## 2017-04-05 LAB — BASIC METABOLIC PANEL
ANION GAP: 9 (ref 5–15)
BUN: 8 mg/dL (ref 6–20)
CO2: 20 mmol/L — ABNORMAL LOW (ref 22–32)
Calcium: 9 mg/dL (ref 8.9–10.3)
Chloride: 113 mmol/L — ABNORMAL HIGH (ref 101–111)
Creatinine, Ser: 1.98 mg/dL — ABNORMAL HIGH (ref 0.44–1.00)
GFR, EST AFRICAN AMERICAN: 28 mL/min — AB (ref >60)
GFR, EST NON AFRICAN AMERICAN: 24 mL/min — AB (ref >60)
GLUCOSE: 169 mg/dL — AB (ref 65–99)
Potassium: 4.1 mmol/L (ref 3.5–5.1)
Sodium: 142 mmol/L (ref 135–145)

## 2017-04-05 LAB — CBC
HEMATOCRIT: 27.8 % — AB (ref 36.0–46.0)
Hemoglobin: 8.8 g/dL — ABNORMAL LOW (ref 12.0–15.0)
MCH: 25 pg — ABNORMAL LOW (ref 26.0–34.0)
MCHC: 31.7 g/dL (ref 30.0–36.0)
MCV: 79 fL (ref 78.0–100.0)
PLATELETS: 289 10*3/uL (ref 150–400)
RBC: 3.52 MIL/uL — AB (ref 3.87–5.11)
RDW: 18.8 % — ABNORMAL HIGH (ref 11.5–15.5)
WBC: 5.6 10*3/uL (ref 4.0–10.5)

## 2017-04-05 MED ORDER — LACTATED RINGERS IV SOLN
INTRAVENOUS | Status: DC
Start: 2017-04-05 — End: 2017-04-06
  Administered 2017-04-05 – 2017-04-06 (×2): via INTRAVENOUS

## 2017-04-05 MED ORDER — METRONIDAZOLE 500 MG PO TABS
500.0000 mg | ORAL_TABLET | Freq: Three times a day (TID) | ORAL | Status: AC
  Administered 2017-04-05 – 2017-04-06 (×3): 500 mg via ORAL
  Filled 2017-04-05 (×3): qty 1

## 2017-04-05 MED ORDER — FAMOTIDINE 20 MG PO TABS
20.0000 mg | ORAL_TABLET | ORAL | Status: DC
  Administered 2017-04-05: 20 mg via ORAL
  Filled 2017-04-05: qty 1

## 2017-04-05 MED ORDER — NEOMYCIN SULFATE 500 MG PO TABS
500.0000 mg | ORAL_TABLET | Freq: Three times a day (TID) | ORAL | Status: AC
  Administered 2017-04-05 – 2017-04-06 (×3): 500 mg via ORAL
  Filled 2017-04-05 (×4): qty 1

## 2017-04-05 MED ORDER — POLYETHYLENE GLYCOL 3350 17 GM/SCOOP PO POWD
1.0000 | Freq: Once | ORAL | Status: DC
  Filled 2017-04-05: qty 255

## 2017-04-05 MED ORDER — GUAIFENESIN 200 MG PO TABS
200.0000 mg | ORAL_TABLET | ORAL | Status: DC | PRN
  Administered 2017-04-05 – 2017-04-06 (×2): 200 mg via ORAL
  Filled 2017-04-05 (×2): qty 1

## 2017-04-05 MED ORDER — SODIUM CHLORIDE 0.9 % IV SOLN: INTRAVENOUS | Status: DC

## 2017-04-05 NOTE — Progress Notes (Signed)
PROGRESS NOTE    Jasmine Cuevas  XLK:[id number] DOB: 1946-01-10 DOA: 03/31/2017 PCP: System, Pcp Not In   Brief Narrative:  71 yo F with hx of anemia, GERD presenting with fatigue, nausea, vomiting, vertigo found to have anemia and positive stool hemoccult.  Admitted for anemia, dizziness, N/V.    Found to have anemia to 5.8.  Colonoscopy with ulcerated friable proximal ascending colon mass.  Also with CT findings concerning for loculated cystic mas that appears to arise from undersurface of L lobe of liver.    Assessment & Plan:   Principal Problem:   Anemia Active Problems:   Dizziness   Malignant neoplasm of ascending colon (HCC)  Invasive Colorectal Adenocarcinoma:  Colonoscopy with large ulcerated and friable proximal ascending colon mass.  Likely malignant.  Seen on colonoscopy and CT imaging.  Colonoscopy with malignant tumor in proximal ascending colon.  Biopsies pending from colonoscopy.   Oncology, surgery consulted '[ ]'  follow up biopsy -> invasive colorectal adenocarcinoma  '[ ]'  CEA (4.8) and CA 19 9 (43)  Per surgery, tentatively planning open laparotomy, R colectomy, and resection of cystic neoplasm on Monday, 10/29 RCRI likely 1 for intraperitoneal surgery.  Likely greater than 4 mets at baseline (no CP with exertion, walks up 2 flights of stairs to get to her apartment).   Complex Multi Loculated Cystic Mass:  Seen on undersurface of L lobe liver on CT imaging.  Surgery, oncology c/s as above.  MRI with 13 cm complex cystic mass which abuts inferior surface of liver, but does not appear to arise from within liver.   Plan for surgery as noted above.     Anemia: microcytic.  5.8 on presentation, improved to 8.7 after transfusion.  H/H stable at 8.8 today.  She's noted clots in stool since colonoscopy and now dark stool, suspect 2/2 iron, will ctm closely.  Positive hemoccult in ED.   Labs pending including iron panel, ferritin, folate, ESR, SPEP, upep, TSH, celiac  panel Low normal iron, low ferritin, will start iron B12 normal.  Folate normal. Normal TSH. Celiac panel with negative TTG, reticulin, and gliadin anitbodies  UPEP with normal urine immunofixation SPEP without evidence of monoclonal protein I don't see ESR ordered, but I don't think this will add much at this point Likely 2/2 colonic mass above.  Biopsies pending from colonoscopy (colorectal adenocarcinoma) and endoscopy (reactive gastropathy and focal erosion, no metaplasia, dysplasia, or malignancy) .   CTM H/H.    Nausea, vomiting, abdominal pain:  Suspect gastroenteritis as sx have resolved.  CT abdomen pelvis as above.     GI path panel and c diff negative.   Vertigo: notes hx of vertigo in past, sx improved with meclizine.  Not recurrent since she's been here.  She has hearing loss, but this is chronic, maybe worse over last week, though vertigo coincided with nausea and vomiting.  Will have work with PT/OT, pending evaluation. MRI notable for meningioma. Echo with normal systolic function, moderate to severe LVH, and trace MR carotid dopplers with 1-39% stenosis bilaterally, antegrade flow in vertebral arteries.  Troponin negative x 3.   EEG noted in admission note, but has not been completed yet. I don't think this is indicated at this time, will hold off for now.  Hearing loss: chronic, but worse over past few weeks.  Exam today with cerumen impairing view of L TM, R TM with normal appearance. Recommend outpatient follow up  UTI F/u urine cx, greater than 100,000 klebsiella, f/u sensivities  Ceftriaxone 10/24 - 10/28  CKD stage III: creatinine has been stable around 1.7-1.8.  Slightly worse today to 1.98, will restart IVF.   FeNA suggests intrinsic Urine without protein or RBC's urine eos negative  NAGMA: likely 2/2 renal disease, will start bicarb  Cough: she notes this is likely 2/2 her GERD.  Will start pepcid. Tessalon perles/guafenisen prn.   3 mm R lower lobe  lung nodule  Thyroid nodules: seen incidentally on carotid US, outpatient f/u  Moderate to severe LVH on echo  No PCP, placed care management consult for PCP  DVT prophylaxis: SCD Code Status: Full  Family Communication: husband in room Disposition Plan: pending   Consultants:   GI, Dr. Paulita Fujita  Procedures: (Don't include imaging studies which can be auto populated. Include things that cannot be auto populated i.e. Echo, Carotid and venous dopplers, Foley, Bipap, HD, tubes/drains, wound vac, central lines etc)  Echo, carotid dopplers as above  Antimicrobials: (specify start and planned stop date. Auto populated tables are space occupying and do not give end dates)  ceftriaxone 10/24 - 28   Subjective: Stressed.  Family was here earlier and there were disagreements.  Complicated relationship between son and her husband. No abdominal pain, n/v.  L ear hasn't been able to hear well for past few weeks.   Objective: Vitals:   04/04/17 2058 04/05/17 0449 04/05/17 0900 04/05/17 1437  BP: (!) 139/53 (!) 146/72  (!) 136/59  Pulse: 82 85  82  Resp: '18 18  16  ' Temp: 98.4 F (36.9 C) 98.1 F (36.7 C)  99.5 F (37.5 C)  TempSrc: Oral Oral    SpO2: 97% 97% 95% 96%  Weight:  83.4 kg (183 lb 13.8 oz)    Height:        Intake/Output Summary (Last 24 hours) at 04/05/17 1532 Last data filed at 04/05/17 1300  Gross per 24 hour  Intake              720 ml  Output                0 ml  Net              720 ml   Filed Weights   04/02/17 1044 04/04/17 0537 04/05/17 0449  Weight: 84.2 kg (185 lb 10 oz) 83 kg (183 lb) 83.4 kg (183 lb 13.8 oz)    Examination:  General: No acute distress.  Sitting up in chair.  Cardiovascular: Heart sounds show Erminio Nygard regular rate, and rhythm. No gallops or rubs. No murmurs. No JVD. Lungs: Clear to auscultation bilaterally with good air movement. No rales, rhonchi or wheezes. Abdomen: Soft, nontender, nondistended with normal active bowel sounds. No  masses. No hepatosplenomegaly. Neurological: Alert and oriented 3. Moves all extremities 4 with equal strength. Cranial nerves II through XII grossly intact. Skin: Warm and dry. No rashes or lesions. Extremities: No clubbing or cyanosis. No edema.  Psychiatric: Mood and affect are normal. Insight and judgment are appropriate.  Data Reviewed: I have personally reviewed following labs and imaging studies  CBC:  Recent Labs Lab 03/31/17 1707 04/01/17 0413 04/02/17 0346 04/03/17 0544 04/04/17 0610 04/05/17 0549  WBC 9.3 7.2 6.8 5.1 4.8 5.6  NEUTROABS 7.6  --   --   --   --   --   HGB 5.8* 8.7* 8.8* 8.3* 8.1* 8.8*  HCT 18.9* 27.0*  28.1* 27.4* 26.6* 25.9* 27.8*  MCV 73.0* 78.3 79.0 79.6 79.4 79.0  PLT 398 369  354 297 296 235   Basic Metabolic Panel:  Recent Labs Lab 04/01/17 0856 04/02/17 0346 04/03/17 0544 04/04/17 0610 04/05/17 0549  NA 148* 142 142 142 142  K 3.9 3.8 4.1 4.1 4.1  CL 120* 116* 115* 114* 113*  CO2 20* 16* 19* 21* 20*  GLUCOSE 99 128* 102* 96 169*  BUN 23* '15 11 9 8  ' CREATININE 1.77* 1.74* 1.86* 1.86* 1.98*  CALCIUM 9.3 8.9 8.8* 8.8* 9.0   GFR: Estimated Creatinine Clearance: 27.8 mL/min (Lizanne Erker) (by C-G formula based on SCr of 1.98 mg/dL (H)). Liver Function Tests:  Recent Labs Lab 03/31/17 1707 04/01/17 0856  AST 13* 15  ALT 9* 11*  ALKPHOS 73 70  BILITOT 0.4 0.4  PROT 6.3* 6.4*  ALBUMIN 3.3* 3.2*    Recent Labs Lab 03/31/17 2030  LIPASE 29   No results for input(s): AMMONIA in the last 168 hours. Coagulation Profile:  Recent Labs Lab 03/31/17 1707  INR 1.04   Cardiac Enzymes:  Recent Labs Lab 04/01/17 0413 04/01/17 0856 04/01/17 1436  TROPONINI <0.03 <0.03 <0.03   BNP (last 3 results) No results for input(s): PROBNP in the last 8760 hours. HbA1C: No results for input(s): HGBA1C in the last 72 hours. CBG: No results for input(s): GLUCAP in the last 168 hours. Lipid Profile: No results for input(s): CHOL, HDL, LDLCALC,  TRIG, CHOLHDL, LDLDIRECT in the last 72 hours. Thyroid Function Tests: No results for input(s): TSH, T4TOTAL, FREET4, T3FREE, THYROIDAB in the last 72 hours. Anemia Panel:  Recent Labs  04/02/17 1958  FOLATE 22.5   Sepsis Labs: No results for input(s): PROCALCITON, LATICACIDVEN in the last 168 hours.  Recent Results (from the past 240 hour(s))  Urine culture     Status: Abnormal   Collection Time: 03/31/17  9:07 PM  Result Value Ref Range Status   Specimen Description URINE, RANDOM  Final   Special Requests NONE  Final   Culture >=100,000 COLONIES/mL KLEBSIELLA PNEUMONIAE (Derron Pipkins)  Final   Report Status 04/03/2017 FINAL  Final   Organism ID, Bacteria KLEBSIELLA PNEUMONIAE (Donnalynn Wheeless)  Final      Susceptibility   Klebsiella pneumoniae - MIC*    AMPICILLIN RESISTANT Resistant     CEFAZOLIN <=4 SENSITIVE Sensitive     CEFTRIAXONE <=1 SENSITIVE Sensitive     CIPROFLOXACIN <=0.25 SENSITIVE Sensitive     GENTAMICIN <=1 SENSITIVE Sensitive     IMIPENEM 0.5 SENSITIVE Sensitive     NITROFURANTOIN <=16 SENSITIVE Sensitive     TRIMETH/SULFA <=20 SENSITIVE Sensitive     AMPICILLIN/SULBACTAM <=2 SENSITIVE Sensitive     PIP/TAZO <=4 SENSITIVE Sensitive     Extended ESBL NEGATIVE Sensitive     * >=100,000 COLONIES/mL KLEBSIELLA PNEUMONIAE  MRSA PCR Screening     Status: None   Collection Time: 03/31/17 10:14 PM  Result Value Ref Range Status   MRSA by PCR NEGATIVE NEGATIVE Final    Comment:        The GeneXpert MRSA Assay (FDA approved for NASAL specimens only), is one component of Mickayla Trouten comprehensive MRSA colonization surveillance program. It is not intended to diagnose MRSA infection nor to guide or monitor treatment for MRSA infections.   Gastrointestinal Panel by PCR , Stool     Status: None   Collection Time: 04/01/17  1:45 PM  Result Value Ref Range Status   Campylobacter species NOT DETECTED NOT DETECTED Final   Plesimonas shigelloides NOT DETECTED NOT DETECTED Final   Salmonella  species NOT DETECTED NOT DETECTED  Final   Yersinia enterocolitica NOT DETECTED NOT DETECTED Final   Vibrio species NOT DETECTED NOT DETECTED Final   Vibrio cholerae NOT DETECTED NOT DETECTED Final   Enteroaggregative E coli (EAEC) NOT DETECTED NOT DETECTED Final   Enteropathogenic E coli (EPEC) NOT DETECTED NOT DETECTED Final   Enterotoxigenic E coli (ETEC) NOT DETECTED NOT DETECTED Final   Shiga like toxin producing E coli (STEC) NOT DETECTED NOT DETECTED Final   Shigella/Enteroinvasive E coli (EIEC) NOT DETECTED NOT DETECTED Final   Cryptosporidium NOT DETECTED NOT DETECTED Final   Cyclospora cayetanensis NOT DETECTED NOT DETECTED Final   Entamoeba histolytica NOT DETECTED NOT DETECTED Final   Giardia lamblia NOT DETECTED NOT DETECTED Final   Adenovirus F40/41 NOT DETECTED NOT DETECTED Final   Astrovirus NOT DETECTED NOT DETECTED Final   Norovirus GI/GII NOT DETECTED NOT DETECTED Final   Rotavirus Talea Manges NOT DETECTED NOT DETECTED Final   Sapovirus (I, II, IV, and V) NOT DETECTED NOT DETECTED Final  C difficile quick scan w PCR reflex     Status: None   Collection Time: 04/01/17  1:45 PM  Result Value Ref Range Status   C Diff antigen NEGATIVE NEGATIVE Final   C Diff toxin NEGATIVE NEGATIVE Final   C Diff interpretation No C. difficile detected.  Final  Surgical pcr screen     Status: None   Collection Time: 04/03/17  4:43 PM  Result Value Ref Range Status   MRSA, PCR NEGATIVE NEGATIVE Final   Staphylococcus aureus NEGATIVE NEGATIVE Final    Comment: (NOTE) The Xpert SA Assay (FDA approved for NASAL specimens in patients 74 years of age and older), is one component of Brittiany Wiehe comprehensive surveillance program. It is not intended to diagnose infection nor to guide or monitor treatment.          Radiology Studies: No results found.      Scheduled Meds: . chlorhexidine  15 mL Mouth Rinse BID  . famotidine  20 mg Oral Q24H  . ferrous sulfate  325 mg Oral BID WC  . mouth rinse   15 mL Mouth Rinse q12n4p  . metroNIDAZOLE  500 mg Oral Q8H  . neomycin  500 mg Oral Q8H  . sodium bicarbonate  650 mg Oral BID   Continuous Infusions: . sodium chloride    . cefTRIAXone (ROCEPHIN)  IV Stopped (04/04/17 2038)  . lactated ringers 100 mL/hr at 04/05/17 1432     LOS: 5 days    Time spent: over 25 minutes    Fayrene Helper, MD [facility] Pager [phone]  If 7PM-7AM, please contact night-coverage [email or web address] Password [email or web address] 04/05/2017, 3:32 PM

## 2017-04-05 NOTE — Progress Notes (Signed)
OT Cancellation Note  Patient Details Name: Cary Wilford MRN: [id number] DOB: March 30, 1946   Cancelled Treatment:    Reason Eval/Treat Not Completed: OT screened, no needs identified, will sign off. Pt in bathroom bathing herself. Spoke with pt and spouse and no current OT needs. Please re-order after surgery if needed. Thanks!  Sheila Oats Kynzee Devinney OTR/L 04/05/2017, 9:59 AM

## 2017-04-06 ENCOUNTER — Encounter (HOSPITAL_COMMUNITY): Payer: Self-pay | Admitting: Anesthesiology

## 2017-04-06 ENCOUNTER — Inpatient Hospital Stay (HOSPITAL_COMMUNITY): Payer: Medicare Other | Admitting: Registered Nurse

## 2017-04-06 ENCOUNTER — Encounter (HOSPITAL_COMMUNITY)
Admission: EM | Disposition: A | Discharge status: Home / Self Care | Payer: Self-pay | Source: Home / Self Care | Attending: Family Medicine

## 2017-04-06 DIAGNOSIS — Z9049 Acquired absence of other specified parts of digestive tract: Secondary | ICD-10-CM

## 2017-04-06 DIAGNOSIS — C182 Malignant neoplasm of ascending colon: Secondary | ICD-10-CM | POA: Diagnosis present

## 2017-04-06 HISTORY — PX: PARTIAL COLECTOMY: SHX5273

## 2017-04-06 LAB — CBC
HCT: 28.1 % — ABNORMAL LOW (ref 36.0–46.0)
HEMATOCRIT: 31.4 % — AB (ref 36.0–46.0)
HEMOGLOBIN: 8.9 g/dL — AB (ref 12.0–15.0)
HEMOGLOBIN: 10.2 g/dL — AB (ref 12.0–15.0)
MCH: 24.9 pg — ABNORMAL LOW (ref 26.0–34.0)
MCH: 26.2 pg (ref 26.0–34.0)
MCHC: 31.7 g/dL (ref 30.0–36.0)
MCHC: 32.5 g/dL (ref 30.0–36.0)
MCV: 78.7 fL (ref 78.0–100.0)
MCV: 80.7 fL (ref 78.0–100.0)
PLATELETS: 243 10*3/uL (ref 150–400)
Platelets: 202 10*3/uL (ref 150–400)
RBC: 3.57 MIL/uL — ABNORMAL LOW (ref 3.87–5.11)
RBC: 3.89 MIL/uL (ref 3.87–5.11)
RDW: 19.2 % — ABNORMAL HIGH (ref 11.5–15.5)
RDW: 18 % — AB (ref 11.5–15.5)
WBC: 4.8 10*3/uL (ref 4.0–10.5)
WBC: 9.7 10*3/uL (ref 4.0–10.5)

## 2017-04-06 LAB — PREPARE RBC (CROSSMATCH)

## 2017-04-06 LAB — BASIC METABOLIC PANEL
ANION GAP: 9 (ref 5–15)
BUN: 9 mg/dL (ref 6–20)
CHLORIDE: 109 mmol/L (ref 101–111)
CO2: 22 mmol/L (ref 22–32)
Calcium: 8.8 mg/dL — ABNORMAL LOW (ref 8.9–10.3)
Creatinine, Ser: 1.91 mg/dL — ABNORMAL HIGH (ref 0.44–1.00)
GFR, EST AFRICAN AMERICAN: 29 mL/min — AB (ref >60)
GFR, EST NON AFRICAN AMERICAN: 25 mL/min — AB (ref >60)
Glucose, Bld: 95 mg/dL (ref 65–99)
POTASSIUM: 4.1 mmol/L (ref 3.5–5.1)
SODIUM: 140 mmol/L (ref 135–145)

## 2017-04-06 LAB — APTT: APTT: 35 s (ref 24–36)

## 2017-04-06 LAB — HEMOGLOBIN A1C
HEMOGLOBIN A1C: 5.4 % (ref 4.8–5.6)
Mean Plasma Glucose: 108.28 mg/dL

## 2017-04-06 LAB — PROTIME-INR
INR: 1.15
Prothrombin Time: 14.6 seconds (ref 11.4–15.2)

## 2017-04-06 SURGERY — COLECTOMY, PARTIAL
Anesthesia: General

## 2017-04-06 MED ORDER — DEXAMETHASONE SODIUM PHOSPHATE 10 MG/ML IJ SOLN
INTRAMUSCULAR | Status: AC
  Filled 2017-04-06: qty 1

## 2017-04-06 MED ORDER — DEXAMETHASONE SODIUM PHOSPHATE 10 MG/ML IJ SOLN
INTRAMUSCULAR | Status: DC | PRN
  Administered 2017-04-06: 10 mg via INTRAVENOUS

## 2017-04-06 MED ORDER — ATROPINE SULFATE 0.4 MG/ML IV SOSY
PREFILLED_SYRINGE | INTRAVENOUS | Status: DC | PRN
  Administered 2017-04-06: .5 mg via INTRAVENOUS

## 2017-04-06 MED ORDER — LIP MEDEX EX OINT
TOPICAL_OINTMENT | CUTANEOUS | Status: AC
  Filled 2017-04-06: qty 7

## 2017-04-06 MED ORDER — ALVIMOPAN 12 MG PO CAPS
12.0000 mg | ORAL_CAPSULE | ORAL | Status: AC
  Administered 2017-04-06: 12 mg via ORAL
  Filled 2017-04-06: qty 1

## 2017-04-06 MED ORDER — FENTANYL CITRATE (PF) 250 MCG/5ML IJ SOLN
INTRAMUSCULAR | Status: AC
  Filled 2017-04-06: qty 5

## 2017-04-06 MED ORDER — PROPOFOL 10 MG/ML IV BOLUS
INTRAVENOUS | Status: DC | PRN
  Administered 2017-04-06: 150 mg via INTRAVENOUS

## 2017-04-06 MED ORDER — PHENYLEPHRINE HCL 10 MG/ML IJ SOLN
INTRAMUSCULAR | Status: AC
  Filled 2017-04-06: qty 2

## 2017-04-06 MED ORDER — ONDANSETRON HCL 4 MG/2ML IJ SOLN
INTRAMUSCULAR | Status: AC
  Filled 2017-04-06: qty 2

## 2017-04-06 MED ORDER — LIDOCAINE 2% (20 MG/ML) 5 ML SYRINGE
INTRAMUSCULAR | Status: DC | PRN
  Administered 2017-04-06: 1 mg/kg/h via INTRAVENOUS

## 2017-04-06 MED ORDER — LIDOCAINE 2% (20 MG/ML) 5 ML SYRINGE
INTRAMUSCULAR | Status: DC | PRN
  Administered 2017-04-06: 80 mg via INTRAVENOUS

## 2017-04-06 MED ORDER — LACTATED RINGERS IV SOLN
INTRAVENOUS | Status: DC | PRN
  Administered 2017-04-06: 13:00:00 via INTRAVENOUS

## 2017-04-06 MED ORDER — LIDOCAINE 2% (20 MG/ML) 5 ML SYRINGE
INTRAMUSCULAR | Status: AC
  Filled 2017-04-06: qty 5

## 2017-04-06 MED ORDER — MEPERIDINE HCL 50 MG/ML IJ SOLN: 6.2500 mg | INTRAMUSCULAR | Status: DC | PRN

## 2017-04-06 MED ORDER — MIDAZOLAM HCL 2 MG/2ML IJ SOLN
INTRAMUSCULAR | Status: AC
  Filled 2017-04-06: qty 2

## 2017-04-06 MED ORDER — ROCURONIUM BROMIDE 50 MG/5ML IV SOSY
PREFILLED_SYRINGE | INTRAVENOUS | Status: AC
  Filled 2017-04-06: qty 5

## 2017-04-06 MED ORDER — ONDANSETRON HCL 4 MG/2ML IJ SOLN
INTRAMUSCULAR | Status: DC | PRN
  Administered 2017-04-06: 4 mg via INTRAVENOUS

## 2017-04-06 MED ORDER — CEFOTETAN DISODIUM 2 G IJ SOLR
2.0000 g | Freq: Once | INTRAMUSCULAR | Status: AC
Start: 2017-04-06 — End: 2017-04-06
  Administered 2017-04-06: 2 g via INTRAVENOUS

## 2017-04-06 MED ORDER — ALVIMOPAN 12 MG PO CAPS
12.0000 mg | ORAL_CAPSULE | Freq: Two times a day (BID) | ORAL | Status: DC
  Administered 2017-04-07 – 2017-04-10 (×7): 12 mg via ORAL
  Filled 2017-04-06 (×8): qty 1

## 2017-04-06 MED ORDER — ALVIMOPAN 12 MG PO CAPS: 12.0000 mg | ORAL_CAPSULE | Freq: Two times a day (BID) | ORAL | Status: DC

## 2017-04-06 MED ORDER — ONDANSETRON HCL 4 MG/2ML IJ SOLN
4.0000 mg | INTRAMUSCULAR | Status: DC | PRN
  Administered 2017-04-07: 4 mg via INTRAVENOUS
  Filled 2017-04-06: qty 2

## 2017-04-06 MED ORDER — ALVIMOPAN 12 MG PO CAPS
12.0000 mg | ORAL_CAPSULE | Freq: Two times a day (BID) | ORAL | Status: DC
  Filled 2017-04-06: qty 1

## 2017-04-06 MED ORDER — SUGAMMADEX SODIUM 200 MG/2ML IV SOLN
INTRAVENOUS | Status: DC | PRN
  Administered 2017-04-06: 400 mg via INTRAVENOUS

## 2017-04-06 MED ORDER — SUGAMMADEX SODIUM 500 MG/5ML IV SOLN
INTRAVENOUS | Status: AC
  Filled 2017-04-06: qty 5

## 2017-04-06 MED ORDER — ROCURONIUM BROMIDE 10 MG/ML (PF) SYRINGE
PREFILLED_SYRINGE | INTRAVENOUS | Status: DC | PRN
Start: 2017-04-06 — End: 2017-04-06
  Administered 2017-04-06: 20 mg via INTRAVENOUS
  Administered 2017-04-06: 50 mg via INTRAVENOUS
  Administered 2017-04-06: 5 mg via INTRAVENOUS

## 2017-04-06 MED ORDER — ALBUMIN HUMAN 5 % IV SOLN
INTRAVENOUS | Status: DC | PRN
  Administered 2017-04-06: 15:00:00 via INTRAVENOUS

## 2017-04-06 MED ORDER — LIDOCAINE 2% (20 MG/ML) 5 ML SYRINGE
INTRAMUSCULAR | Status: AC
  Filled 2017-04-06: qty 10

## 2017-04-06 MED ORDER — ATROPINE SULFATE 1 MG/10ML IJ SOSY
PREFILLED_SYRINGE | INTRAMUSCULAR | Status: AC
  Filled 2017-04-06: qty 10

## 2017-04-06 MED ORDER — LACTATED RINGERS IV SOLN
INTRAVENOUS | Status: DC
Start: 2017-04-06 — End: 2017-04-06
  Administered 2017-04-06 (×2): via INTRAVENOUS

## 2017-04-06 MED ORDER — LACTATED RINGERS IV SOLN: INTRAVENOUS | Status: DC

## 2017-04-06 MED ORDER — PANTOPRAZOLE SODIUM 40 MG IV SOLR
40.0000 mg | INTRAVENOUS | Status: DC
  Administered 2017-04-06 – 2017-04-08 (×3): 40 mg via INTRAVENOUS
  Filled 2017-04-06 (×3): qty 40

## 2017-04-06 MED ORDER — DEXTROSE 5 % IV SOLN
2.0000 g | Freq: Two times a day (BID) | INTRAVENOUS | Status: AC
  Administered 2017-04-06: 2 g via INTRAVENOUS
  Filled 2017-04-06: qty 2

## 2017-04-06 MED ORDER — ONDANSETRON HCL 4 MG PO TABS: 4.0000 mg | ORAL_TABLET | Freq: Four times a day (QID) | ORAL | Status: DC | PRN

## 2017-04-06 MED ORDER — PROPOFOL 10 MG/ML IV BOLUS
INTRAVENOUS | Status: AC
  Filled 2017-04-06: qty 20

## 2017-04-06 MED ORDER — ALBUMIN HUMAN 5 % IV SOLN
INTRAVENOUS | Status: AC
  Filled 2017-04-06: qty 250

## 2017-04-06 MED ORDER — CEFOTETAN DISODIUM-DEXTROSE 2-2.08 GM-%(50ML) IV SOLR
INTRAVENOUS | Status: AC
  Administered 2017-04-06: 2 g
  Filled 2017-04-06: qty 50

## 2017-04-06 MED ORDER — HYDROMORPHONE HCL 1 MG/ML IJ SOLN
INTRAMUSCULAR | Status: AC
  Administered 2017-04-06: 0.5 mg via INTRAVENOUS
  Filled 2017-04-06: qty 2

## 2017-04-06 MED ORDER — HYDROMORPHONE HCL 1 MG/ML IJ SOLN
0.2500 mg | INTRAMUSCULAR | Status: DC | PRN
  Administered 2017-04-06: 0.25 mg via INTRAVENOUS
  Administered 2017-04-06 (×2): 0.5 mg via INTRAVENOUS

## 2017-04-06 MED ORDER — FENTANYL CITRATE (PF) 100 MCG/2ML IJ SOLN
12.5000 ug | INTRAMUSCULAR | Status: DC | PRN
  Administered 2017-04-06 – 2017-04-10 (×8): 25 ug via INTRAVENOUS
  Filled 2017-04-06 (×9): qty 2

## 2017-04-06 MED ORDER — DEXTROSE-NACL 5-0.9 % IV SOLN
INTRAVENOUS | Status: DC
  Administered 2017-04-06 – 2017-04-07 (×3): via INTRAVENOUS

## 2017-04-06 MED ORDER — METHOCARBAMOL 1000 MG/10ML IJ SOLN
500.0000 mg | Freq: Three times a day (TID) | INTRAMUSCULAR | Status: DC
  Administered 2017-04-06 – 2017-04-08 (×7): 500 mg via INTRAVENOUS
  Filled 2017-04-06: qty 550
  Filled 2017-04-06 (×2): qty 5
  Filled 2017-04-06 (×2): qty 550
  Filled 2017-04-06: qty 5
  Filled 2017-04-06 (×2): qty 550
  Filled 2017-04-06: qty 5
  Filled 2017-04-06: qty 550

## 2017-04-06 MED ORDER — SODIUM CHLORIDE 0.9 % IV SOLN
INTRAVENOUS | Status: DC | PRN
  Administered 2017-04-06: 15:00:00 via INTRAVENOUS

## 2017-04-06 MED ORDER — FENTANYL CITRATE (PF) 100 MCG/2ML IJ SOLN
INTRAMUSCULAR | Status: DC | PRN
  Administered 2017-04-06 (×3): 50 ug via INTRAVENOUS

## 2017-04-06 MED ORDER — PHENYLEPHRINE HCL 10 MG/ML IJ SOLN
INTRAMUSCULAR | Status: DC | PRN
  Administered 2017-04-06: 25 ug/min via INTRAVENOUS

## 2017-04-06 MED ORDER — MIDAZOLAM HCL 5 MG/5ML IJ SOLN
INTRAMUSCULAR | Status: DC | PRN
  Administered 2017-04-06 (×2): 1 mg via INTRAVENOUS

## 2017-04-06 MED ORDER — LIDOCAINE 2% (20 MG/ML) 5 ML SYRINGE: INTRAMUSCULAR | Status: DC

## 2017-04-06 MED ORDER — PROMETHAZINE HCL 25 MG/ML IJ SOLN: 6.2500 mg | INTRAMUSCULAR | Status: DC | PRN

## 2017-04-06 SURGICAL SUPPLY — 64 items
APPLICATOR COTTON TIP 6IN STRL (MISCELLANEOUS) IMPLANT
BLADE EXTENDED COATED 6.5IN (ELECTRODE) IMPLANT
BLADE HEX COATED 2.75 (ELECTRODE) ×3 IMPLANT
BLADE SURG SZ10 CARB STEEL (BLADE) IMPLANT
CLIP VESOCCLUDE LG 6/CT (CLIP) IMPLANT
COVER MAYO STAND STRL (DRAPES) ×3 IMPLANT
DRAIN CHANNEL 10F 3/8 F FF (DRAIN) IMPLANT
DRAIN CHANNEL RND F F (WOUND CARE) ×3 IMPLANT
DRAPE INCISE IOBAN 66X45 STRL (DRAPES) IMPLANT
DRAPE LAPAROSCOPIC ABDOMINAL (DRAPES) ×3 IMPLANT
DRAPE SHEET LG 3/4 BI-LAMINATE (DRAPES) IMPLANT
DRAPE WARM FLUID 44X44 (DRAPE) IMPLANT
DRSG OPSITE POSTOP 4X12 (GAUZE/BANDAGES/DRESSINGS) ×3 IMPLANT
DRSG PAD ABDOMINAL 8X10 ST (GAUZE/BANDAGES/DRESSINGS) IMPLANT
ELECT REM PT RETURN 15FT ADLT (MISCELLANEOUS) ×3 IMPLANT
EVACUATOR 1/8 PVC DRAIN (DRAIN) ×3 IMPLANT
EVACUATOR DRAINAGE 10X20 100CC (DRAIN) IMPLANT
EVACUATOR SILICONE 100CC (DRAIN) IMPLANT
GAUZE SPONGE 4X4 12PLY STRL (GAUZE/BANDAGES/DRESSINGS) ×3 IMPLANT
GAUZE SPONGE 4X4 16PLY XRAY LF (GAUZE/BANDAGES/DRESSINGS) IMPLANT
GLOVE BIOGEL PI IND STRL 7.0 (GLOVE) ×1 IMPLANT
GLOVE BIOGEL PI INDICATOR 7.0 (GLOVE) ×2
GLOVE ECLIPSE 8.0 STRL XLNG CF (GLOVE) ×3 IMPLANT
GLOVE INDICATOR 8.0 STRL GRN (GLOVE) ×6 IMPLANT
GOWN STRL REUS W/TWL LRG LVL3 (GOWN DISPOSABLE) ×3 IMPLANT
GOWN STRL REUS W/TWL XL LVL3 (GOWN DISPOSABLE) ×6 IMPLANT
HANDLE SUCTION POOLE (INSTRUMENTS) ×1 IMPLANT
HEMOSTAT SNOW SURGICEL 2X4 (HEMOSTASIS) ×9 IMPLANT
KIT BASIN OR (CUSTOM PROCEDURE TRAY) ×3 IMPLANT
LEGGING LITHOTOMY PAIR STRL (DRAPES) IMPLANT
LIGASURE IMPACT 36 18CM CVD LR (INSTRUMENTS) ×3 IMPLANT
NS IRRIG 1000ML POUR BTL (IV SOLUTION) ×6 IMPLANT
PACK COLON (CUSTOM PROCEDURE TRAY) ×3 IMPLANT
PACK GENERAL/GYN (CUSTOM PROCEDURE TRAY) IMPLANT
PAD TELFA 2X3 NADH STRL (GAUZE/BANDAGES/DRESSINGS) IMPLANT
RELOAD PROXIMATE 75MM BLUE (ENDOMECHANICALS) ×6 IMPLANT
SHEARS HARMONIC ACE PLUS 36CM (ENDOMECHANICALS) IMPLANT
SPONGE LAP 18X18 X RAY DECT (DISPOSABLE) ×3 IMPLANT
STAPLER GUN LINEAR PROX 60 (STAPLE) ×3 IMPLANT
STAPLER VISISTAT 35W (STAPLE) ×3 IMPLANT
SUCTION POOLE HANDLE (INSTRUMENTS) ×3
SUT CHROMIC 0 SH (SUTURE) IMPLANT
SUT ETHILON 3 0 PS 1 (SUTURE) ×3 IMPLANT
SUT NOV 1 T60/GS (SUTURE) IMPLANT
SUT NOVA NAB DX-16 0-1 5-0 T12 (SUTURE) IMPLANT
SUT NOVA T20/GS 25 (SUTURE) IMPLANT
SUT PDS AB 1 CTX 36 (SUTURE) ×6 IMPLANT
SUT SILK 2 0 (SUTURE) ×4
SUT SILK 2 0 SH CR/8 (SUTURE) ×3 IMPLANT
SUT SILK 2 0SH CR/8 30 (SUTURE) IMPLANT
SUT SILK 2-0 18XBRD TIE 12 (SUTURE) ×1 IMPLANT
SUT SILK 2-0 30XBRD TIE 12 (SUTURE) ×1 IMPLANT
SUT SILK 3 0 (SUTURE) ×2
SUT SILK 3 0 SH CR/8 (SUTURE) ×6 IMPLANT
SUT SILK 3-0 18XBRD TIE 12 (SUTURE) ×1 IMPLANT
SUT VIC AB 2-0 SH 18 (SUTURE) IMPLANT
SUT VIC AB 3-0 SH 18 (SUTURE) IMPLANT
SUT VICRYL 2 0 18  UND BR (SUTURE) ×4
SUT VICRYL 2 0 18 UND BR (SUTURE) ×2 IMPLANT
TOWEL OR 17X26 10 PK STRL BLUE (TOWEL DISPOSABLE) ×6 IMPLANT
TOWEL OR NON WOVEN STRL DISP B (DISPOSABLE) ×3 IMPLANT
TRAY FOLEY BAG SILVER LF 14FR (CATHETERS) IMPLANT
TRAY FOLEY W/METER SILVER 16FR (SET/KITS/TRAYS/PACK) ×3 IMPLANT
YANKAUER SUCT BULB TIP NO VENT (SUCTIONS) ×3 IMPLANT

## 2017-04-06 NOTE — Progress Notes (Signed)
PROGRESS NOTE    Jasmine Cuevas  WIO:[id number] DOB: 08/21/1945 DOA: 03/31/2017 PCP: System, Pcp Not In   Brief Narrative:  71 yo F with hx of anemia, GERD presenting with fatigue, nausea, vomiting, vertigo found to have anemia and positive stool hemoccult.  Admitted for anemia, dizziness, N/V.    Found to have anemia to 5.8.  Colonoscopy with ulcerated friable proximal ascending colon mass.  Also with CT findings concerning for loculated cystic mas that appears to arise from undersurface of L lobe of liver.    Assessment & Plan:   Principal Problem:   Anemia Active Problems:   Dizziness   Malignant neoplasm of ascending colon (HCC)   CKD (chronic kidney disease), stage III (HCC)   Hearing loss   Urinary tract infection   Cough   Metabolic acidosis   GERD (gastroesophageal reflux disease)   Thyroid nodule   LVH (left ventricular hypertrophy)  Invasive Colorectal Adenocarcinoma:  Colonoscopy with large ulcerated and friable proximal ascending colon mass.  Likely malignant.  Seen on colonoscopy and CT imaging.  Colonoscopy with malignant tumor in proximal ascending colon.  Biopsies pending from colonoscopy.   Oncology, surgery consulted _0  follow up biopsy -> invasive colorectal adenocarcinoma  _1  CEA (4.8) and CA 19 9 (43)  Per surgery, tentatively planning open laparotomy, R colectomy, and resection of cystic neoplasm on today, 10/29 RCRI likely 1 for intraperitoneal surgery.  Likely greater than 4 mets at baseline (no CP with exertion, walks up 2 flights of stairs to get to her apartment).   Complex Multi Loculated Cystic Mass:  Seen on undersurface of L lobe liver on CT imaging.  Surgery, oncology c/s as above.  MRI with 13 cm complex cystic mass which abuts inferior surface of liver, but does not appear to arise from within liver.   Plan for surgery as noted above.     Anemia: microcytic.  5.8 on presentation, improved to 8.7 after transfusion.  H/H stable today.  She's  noted clots in stool since colonoscopy and now dark stool, suspect 2/2 iron, will ctm closely.  Positive hemoccult in ED.   Labs pending including iron panel, ferritin, folate, ESR, SPEP, upep, TSH, celiac panel Low normal iron, low ferritin, will start iron B12 normal.  Folate normal. Normal TSH. Celiac panel with negative TTG, reticulin, and gliadin anitbodies  UPEP with normal urine immunofixation SPEP without evidence of monoclonal protein I don't see ESR ordered, but I don't think this will add much at this point Likely 2/2 colonic mass above.  Biopsies pending from colonoscopy (colorectal adenocarcinoma) and endoscopy (reactive gastropathy and focal erosion, no metaplasia, dysplasia, or malignancy) .   CTM H/H.    Nausea, vomiting, abdominal pain:  Suspect gastroenteritis as sx have resolved.  CT abdomen pelvis as above.     GI path panel and c diff negative.   Vertigo: notes hx of vertigo in past, sx improved with meclizine.  Not recurrent since she's been here.  She has hearing loss, but this is chronic, maybe worse over last week, though vertigo coincided with nausea and vomiting.  Will have work with PT/OT, pending evaluation. MRI notable for meningioma. Echo with normal systolic function, moderate to severe LVH, and trace MR carotid dopplers with 1-39% stenosis bilaterally, antegrade flow in vertebral arteries.  Troponin negative x 3.   EEG noted in admission note, but has not been completed yet. I don't think this is indicated at this time, will hold off for now.  Hearing loss: chronic, but worse over past few weeks.  Exam today with cerumen impairing view of L TM, R TM with normal appearance. Recommend outpatient follow up  UTI F/u urine cx, greater than 100,000 klebsiella, f/u sensivities  Ceftriaxone 10/24 - 10/28  CKD stage III: creatinine has been stable around 1.7-1.8.  Slightly worse today to 1.98, will restart IVF.   FeNA suggests intrinsic Urine without protein or  RBC's urine eos negative  NAGMA: likely 2/2 renal disease, will start bicarb  Cough: she notes this is likely 2/2 her GERD.  Will start pepcid. Tessalon perles/guafenisen prn.   3 mm R lower lobe lung nodule  Thyroid nodules: seen incidentally on carotid US, outpatient f/u  Moderate to severe LVH on echo  No PCP, placed care management consult for PCP  DVT prophylaxis: SCD Code Status: Full  Family Communication: husband in room Disposition Plan: pending   Consultants:   GI, Dr. Paulita Fujita  Procedures: (Don't include imaging studies which can be auto populated. Include things that cannot be auto populated i.e. Echo, Carotid and venous dopplers, Foley, Bipap, HD, tubes/drains, wound vac, central lines etc)  Echo, carotid dopplers as above  Antimicrobials: (specify start and planned stop date. Auto populated tables are space occupying and do not give end dates)  ceftriaxone 10/24 - 28   Subjective: Doing ok. Daughter came into room, worried and anxious, appeared intoxicated.    Objective: Vitals:   04/05/17 1437 04/05/17 2108 04/06/17 0456 04/06/17 0922  BP: (!) 136/59 (!) 156/63 (!) 127/53   Pulse: 82 78 77   Resp: _0 Temp: 99.5 F (37.5 C) 99.6 F (37.6 C) 99.7 F (37.6 C)   TempSrc:  Oral Oral   SpO2: 96% 94% 93% 93%  Weight:   81.6 kg (179 lb 12.8 oz)   Height:        Intake/Output Summary (Last 24 hours) at 04/06/17 1350 Last data filed at 04/06/17 1344  Gross per 24 hour  Intake          1346.67 ml  Output              350 ml  Net           996.67 ml   Filed Weights   04/04/17 0537 04/05/17 0449 04/06/17 0456  Weight: 83 kg (183 lb) 83.4 kg (183 lb 13.8 oz) 81.6 kg (179 lb 12.8 oz)    Examination:  General: No acute distress. Cardiovascular: Heart sounds show a regular rate, and rhythm. No gallops or rubs. No murmurs. No JVD. Lungs: Clear to auscultation bilaterally with good air movement. No rales, rhonchi or wheezes. Abdomen: Soft,  nontender, nondistended with normal active bowel sounds. No masses. No hepatosplenomegaly. Neurological: Alert and oriented 3. Moves all extremities 4 with equal strength. Cranial nerves II through XII grossly intact. Skin: Warm and dry. No rashes or lesions. Extremities: No clubbing or cyanosis. No edema.  Psychiatric: Mood and affect are normal. Insight and judgment are appropriate.  Data Reviewed: I have personally reviewed following labs and imaging studies  CBC:  Recent Labs Lab 03/31/17 1707  04/02/17 0346 04/03/17 0544 04/04/17 0610 04/05/17 0549 04/06/17 0553  WBC 9.3  < > 6.8 5.1 4.8 5.6 4.8  NEUTROABS 7.6  --   --   --   --   --   --   HGB 5.8*  < > 8.8* 8.3* 8.1* 8.8* 8.9*  HCT 18.9*  < > 27.4* 26.6* 25.9* 27.8*  28.1*  MCV 73.0*  < > 79.0 79.6 79.4 79.0 78.7  PLT 398  < > 354 297 296 289 243  < > = values in this interval not displayed. Basic Metabolic Panel:  Recent Labs Lab 04/02/17 0346 04/03/17 0544 04/04/17 0610 04/05/17 0549 04/06/17 0553  NA 142 142 142 142 140  K 3.8 4.1 4.1 4.1 4.1  CL 116* 115* 114* 113* 109  CO2 16* 19* 21* 20* 22  GLUCOSE 128* 102* 96 169* 95  BUN _0 CREATININE 1.74* 1.86* 1.86* 1.98* 1.91*  CALCIUM 8.9 8.8* 8.8* 9.0 8.8*   GFR: Estimated Creatinine Clearance: 28.5 mL/min (A) (by C-G formula based on SCr of 1.91 mg/dL (H)). Liver Function Tests:  Recent Labs Lab 03/31/17 1707 04/01/17 0856  AST 13* 15  ALT 9* 11*  ALKPHOS 73 70  BILITOT 0.4 0.4  PROT 6.3* 6.4*  ALBUMIN 3.3* 3.2*    Recent Labs Lab 03/31/17 2030  LIPASE 29   No results for input(s): AMMONIA in the last 168 hours. Coagulation Profile:  Recent Labs Lab 03/31/17 1707 04/06/17 0553  INR 1.04 1.15   Cardiac Enzymes:  Recent Labs Lab 04/01/17 0413 04/01/17 0856 04/01/17 1436  TROPONINI <0.03 <0.03 <0.03   BNP (last 3 results) No results for input(s): PROBNP in the last 8760 hours. HbA1C:  Recent Labs  04/05/17 0542    HGBA1C 5.4   CBG: No results for input(s): GLUCAP in the last 168 hours. Lipid Profile: No results for input(s): CHOL, HDL, LDLCALC, TRIG, CHOLHDL, LDLDIRECT in the last 72 hours. Thyroid Function Tests: No results for input(s): TSH, T4TOTAL, FREET4, T3FREE, THYROIDAB in the last 72 hours. Anemia Panel: No results for input(s): VITAMINB12, FOLATE, FERRITIN, TIBC, IRON, RETICCTPCT in the last 72 hours. Sepsis Labs: No results for input(s): PROCALCITON, LATICACIDVEN in the last 168 hours.  Recent Results (from the past 240 hour(s))  Urine culture     Status: Abnormal   Collection Time: 03/31/17  9:07 PM  Result Value Ref Range Status   Specimen Description URINE, RANDOM  Final   Special Requests NONE  Final   Culture >=100,000 COLONIES/mL KLEBSIELLA PNEUMONIAE (A)  Final   Report Status 04/03/2017 FINAL  Final   Organism ID, Bacteria KLEBSIELLA PNEUMONIAE (A)  Final      Susceptibility   Klebsiella pneumoniae - MIC*    AMPICILLIN RESISTANT Resistant     CEFAZOLIN <=4 SENSITIVE Sensitive     CEFTRIAXONE <=1 SENSITIVE Sensitive     CIPROFLOXACIN <=0.25 SENSITIVE Sensitive     GENTAMICIN <=1 SENSITIVE Sensitive     IMIPENEM 0.5 SENSITIVE Sensitive     NITROFURANTOIN <=16 SENSITIVE Sensitive     TRIMETH/SULFA <=20 SENSITIVE Sensitive     AMPICILLIN/SULBACTAM <=2 SENSITIVE Sensitive     PIP/TAZO <=4 SENSITIVE Sensitive     Extended ESBL NEGATIVE Sensitive     * >=100,000 COLONIES/mL KLEBSIELLA PNEUMONIAE  MRSA PCR Screening     Status: None   Collection Time: 03/31/17 10:14 PM  Result Value Ref Range Status   MRSA by PCR NEGATIVE NEGATIVE Final    Comment:        The GeneXpert MRSA Assay (FDA approved for NASAL specimens only), is one component of a comprehensive MRSA colonization surveillance program. It is not intended to diagnose MRSA infection nor to guide or monitor treatment for MRSA infections.   Gastrointestinal Panel by PCR , Stool     Status: None   Collection  Time:  04/01/17  1:45 PM  Result Value Ref Range Status   Campylobacter species NOT DETECTED NOT DETECTED Final   Plesimonas shigelloides NOT DETECTED NOT DETECTED Final   Salmonella species NOT DETECTED NOT DETECTED Final   Yersinia enterocolitica NOT DETECTED NOT DETECTED Final   Vibrio species NOT DETECTED NOT DETECTED Final   Vibrio cholerae NOT DETECTED NOT DETECTED Final   Enteroaggregative E coli (EAEC) NOT DETECTED NOT DETECTED Final   Enteropathogenic E coli (EPEC) NOT DETECTED NOT DETECTED Final   Enterotoxigenic E coli (ETEC) NOT DETECTED NOT DETECTED Final   Shiga like toxin producing E coli (STEC) NOT DETECTED NOT DETECTED Final   Shigella/Enteroinvasive E coli (EIEC) NOT DETECTED NOT DETECTED Final   Cryptosporidium NOT DETECTED NOT DETECTED Final   Cyclospora cayetanensis NOT DETECTED NOT DETECTED Final   Entamoeba histolytica NOT DETECTED NOT DETECTED Final   Giardia lamblia NOT DETECTED NOT DETECTED Final   Adenovirus F40/41 NOT DETECTED NOT DETECTED Final   Astrovirus NOT DETECTED NOT DETECTED Final   Norovirus GI/GII NOT DETECTED NOT DETECTED Final   Rotavirus A NOT DETECTED NOT DETECTED Final   Sapovirus (I, II, IV, and V) NOT DETECTED NOT DETECTED Final  C difficile quick scan w PCR reflex     Status: None   Collection Time: 04/01/17  1:45 PM  Result Value Ref Range Status   C Diff antigen NEGATIVE NEGATIVE Final   C Diff toxin NEGATIVE NEGATIVE Final   C Diff interpretation No C. difficile detected.  Final  Surgical pcr screen     Status: None   Collection Time: 04/03/17  4:43 PM  Result Value Ref Range Status   MRSA, PCR NEGATIVE NEGATIVE Final   Staphylococcus aureus NEGATIVE NEGATIVE Final    Comment: (NOTE) The Xpert SA Assay (FDA approved for NASAL specimens in patients 14 years of age and older), is one component of a comprehensive surveillance program. It is not intended to diagnose infection nor to guide or monitor treatment.           Radiology Studies: No results found.      Scheduled Meds: . [MAR Hold] alvimopan  12 mg Oral BID  . cefoTEtan in Dextrose 5%      . [MAR Hold] chlorhexidine  15 mL Mouth Rinse BID  . [MAR Hold] famotidine  20 mg Oral Q24H  . [MAR Hold] ferrous sulfate  325 mg Oral BID WC  . lip balm      . [MAR Hold] mouth rinse  15 mL Mouth Rinse q12n4p  . [MAR Hold] sodium bicarbonate  650 mg Oral BID   Continuous Infusions: . [MAR Hold] sodium chloride    . lactated ringers 100 mL/hr at 04/06/17 0157  . lactated ringers 20 mL/hr at 04/06/17 1141  . lactated ringers    . lidocaine       LOS: 6 days    Time spent: over 25 minutes    Fayrene Helper, MD [facility] Pager [phone]  If 7PM-7AM, please contact night-coverage [email or web address] Password [email or web address] 04/06/2017, 1:50 PM

## 2017-04-06 NOTE — Anesthesia Procedure Notes (Signed)
Procedure Name: Intubation Date/Time: 04/06/2017 12:56 PM Performed by: Carleene Cooper A Pre-anesthesia Checklist: Patient identified, Emergency Drugs available, Suction available, Patient being monitored and Timeout performed Patient Re-evaluated:Patient Re-evaluated prior to induction Oxygen Delivery Method: Circle system utilized Preoxygenation: Pre-oxygenation with 100% oxygen Induction Type: IV induction Ventilation: Mask ventilation without difficulty Laryngoscope Size: Mac and 4 Grade View: Grade I Tube type: Oral Tube size: 7.5 mm Number of attempts: 1 Airway Equipment and Method: Stylet Placement Confirmation: ETT inserted through vocal cords under direct vision,  positive ETCO2 and breath sounds checked- equal and bilateral Secured at: 22 cm Tube secured with: Tape Dental Injury: Teeth and Oropharynx as per pre-operative assessment

## 2017-04-06 NOTE — Progress Notes (Signed)
   04/06/17 1000  Clinical Encounter Type  Visited With Patient and family together  Visit Type Initial;Psychological support;Spiritual support;Pre-op  Referral From Nurse  Consult/Referral To Chaplain  Spiritual Encounters  Spiritual Needs Prayer;Emotional;Other (Comment) (Pastoral Conversation/Support)  Stress Factors  Patient Stress Factors Family relationships;Major life changes;Health changes;Other (Comment) (Anxiety over surgery )  Family Stress Factors Family relationships;Health changes;Major life changes;Other (Comment) (Anxiety over surgery )   I visited with the patient per Spiritual Care consult and referral by the nurse for pre-op prayer. The patient was very anxious during our visit over her surgery and over an incident that occurred with her son the previous day.  The patient's husband was at the bedside and explained that he and the son had a disagreement. This was causing both the patient and the spouse anxiety. The patient was worried that if anything happened to her that her son wouldn't know how much she loves him.  The patient requested that I contact her son and tell him how much she loves him. I agreed to do this.  The patient's daughter arrived and it was apparent that she was intoxicated. I notified the nurse. The patient's daughter would interrupt the patient when she was talking and the patient's anxiety worsened.  I provided a neck pillow and a prayer shawl for the patient.  We prayed at the bedside.  Please, contact Spiritual Care for further assistance.   [place] M.Div.

## 2017-04-06 NOTE — Op Note (Signed)
Operative Note  Jasmine Cuevas female 71 y.o. 04/06/2017  PREOPERATIVE DX:  1.  Right colon cancer.  2.  Cystic abdominal neoplasm  POSTOPERATIVE DX:  Same  PROCEDURE:   Exploratory laparotomy, right colectomy, removal of cystic abdominal neoplasm         Surgeon: Odis Hollingshead   Assistants: Romana Juniper, M.D.  Anesthesia: General endotracheal anesthesia  Indications:   This is a 71 year old female who was admitted 04/03/2007 13 with profound dizziness also having diarrhea.  She had ongoing dry heaves.  Her evaluation demonstrated her to be profoundly anemic with a hemoglobin of 5.8.  Further workup was performed.  CT scan demonstrated a right colon lesion as well as a large cystic neoplasm.  Colonoscopy demonstrated the right colon mass.  Biopsy was positive for adenocarcinoma.  MRI demonstrated the cystic neoplasm which did not appeared to be coming off the liver.  She is undergone a bowel prep for her colonoscopy.  She now presents for the above operation.    Procedure Detail: She was brought to the operating room placed supine on the operating table and the general anesthetic was given.  A Foley catheter and orogastric tube were inserted.  The abdominal wall was widely sterilely prepped and draped.  A timeout was performed.  A long midline incision was made beginning above the umbilicus and extending toward the pelvis.  All layers were divided including the fascia and peritoneum.  Upon entering the peritoneal cavity, no peritoneal metastasis or pelvic metastases were noted.  The right colon cancer was palpable in the mid right colon.  The right colon was mobilized by dividing its lateral attachments using electrocautery.  The hepatic flexure was adherent to retroperitoneal structures.  This was carefully mobilized using electrocautery and the LigaSure.  A point on the transverse colon was chosen just proximal to the middle colic vessels.  A defect was created by using  electrocautery and blunt dissection in the mesentery.  Omentum was also dissected free from this.  The colon was then divided using a linear cutting stapler.  The cystic neoplasm was evident during this part of the procedure being underneath the left lobe of the liver.  I then divided the terminal ileum proximal to the ileocecal valve.  The mesentery was divided with the LigaSure and the right colic and ileocolic vessels were suture-ligated.  The specimen was passed off the field.  A side-to-side stapled anastomosis was then performed with a linear cutting stapler.  The common defect was closed with a linear cutting stapler.  A crotch stitch of 3-0 silk was placed.  The anastomosis was patent, viable, and under no tension.  Orientation of the small bowel was proper.  Following this, I addressed the cystic neoplasm.  There appeared to be an attachment to the falciform ligament.  Using the LigaSure I divided the falciform ligament and mobilized this.  There appeared to be attachments to the underside of the medial aspect of the left lobe of the liver.  There is some peritoneal attachments of the gallbladder but I was able to divide these using the LigaSure and separate this.  Some of the neoplasm was also adherent to Glisson's capsule.  Using electrocautery and the LigaSure, I was able to remove the cystic neoplasm from its attachments.  The feeder vessel was a avulsed over a branch of the portal vein.  The specimen was passed off the field.  I then repaired the defect in the branch of the portal vein  a single  4-0 Prolene suture.  This controlled the bleeding.  Bleeding from the raw surface of the liver was controlled with electrocautery and Surgicel.  I then carefully inspected the area around the liver.  The liver was viable without areas of ischemia.  There were no metastatic lesions noted in the liver.  Hemostasis was adequate from the area of resection of the cystic neoplasm.  I then closed the mesenteric  defect at the site of the colectomy with interrupted 3-0 silk sutures.  The abdominal cavity was irrigated.  A stab incision was made in the right lower quadrant.  A 19 Blake drain was placed through this incision and positioned inferior to the left lobe of the liver.  The drain was anchored to the skin with 3-0 nylon suture.  Sponge counts and instrument counts were reported to be correct at this time.  Midline fascia was then closed with running #1 PDS suture.  Subcutaneous tissue was irrigated.  Skin was closed with staples.  Needle sponge and instrument counts were reported to be correct.  She tolerated the procedure well without any apparent complications.  She was taken to the recovery room in satisfactory condition.    Findings: The right colon cancer was in the midportion of the right colon.  A large cystic neoplasm  was attached to the capsule of the inferior aspect of the left lobe of the liver with a feeder vessel coming off a branch of the portal vein.  Estimated Blood Loss:  900 cc         Drains:  900 ml         Specimens: Right colon, cystic neoplasm        Complications:  * No complications entered in OR log *         Disposition: PACU - hemodynamically stable.         Condition: stable

## 2017-04-06 NOTE — Progress Notes (Signed)
Lab called at 1605 for CBC draw in PACU.  RN called lab x2 again in PACU and in [facility],  with no one answering at [phone].  RN stopped in LAB office, no known issues, techs not in office.  Brianna RN aware CBC needed in ICU.

## 2017-04-06 NOTE — Transfer of Care (Signed)
Immediate Anesthesia Transfer of Care Note  Patient: Jasmine Cuevas  Procedure(s) Performed: OPEN RIGHT HEMICOLECTOMY AND RESECTION OF CYSTIC ABDOMINAL MASS (N/A )  Patient Location: PACU  Anesthesia Type:General  Level of Consciousness: sedated  Airway & Oxygen Therapy: Patient connected to face mask oxygen and Patient connected to face mask  Post-op Assessment: Report given to RN and Post -op Vital signs reviewed and stable  Post vital signs: Reviewed and stable  Last Vitals:  Vitals:   04/06/17 0456 04/06/17 0922  BP: (!) 127/53   Pulse: 77   Resp: 20   Temp: 37.6 C   SpO2: 93% 93%    Last Pain:  Vitals:   04/06/17 0456  TempSrc: Oral         Complications: No apparent anesthesia complications

## 2017-04-06 NOTE — Progress Notes (Signed)
Patient prepared for surgery. Consent signed. Jewelery removed and given to husband. Husband and friend gathered belongings in room. Chaplain consulted. Unit director assisted in managing anxiety in family members. Patient taken to OR.

## 2017-04-06 NOTE — Anesthesia Preprocedure Evaluation (Addendum)
Anesthesia Evaluation  Patient identified by MRN, date of birth, ID band Patient awake    Reviewed: Allergy & Precautions, NPO status , Patient's Chart, lab work & pertinent test results  Airway Mallampati: I  TM Distance: >3 FB Neck ROM: Full    Dental  (+) Teeth Intact, Dental Advisory Given   Pulmonary asthma ,    breath sounds clear to auscultation       Cardiovascular negative cardio ROS   Rhythm:Regular Rate:Normal     Neuro/Psych negative neurological ROS     GI/Hepatic Neg liver ROS, GERD  ,  Endo/Other  negative endocrine ROS  Renal/GU Renal disease     Musculoskeletal negative musculoskeletal ROS (+)   Abdominal   Peds  Hematology negative hematology ROS (+)   Anesthesia Other Findings Day of surgery medications reviewed with the patient.  Reproductive/Obstetrics                            Anesthesia Physical Anesthesia Plan  ASA: II  Anesthesia Plan: General   Post-op Pain Management:    Induction: Intravenous  PONV Risk Score and Plan: 4 or greater and Ondansetron, Dexamethasone, Midazolam and Treatment may vary due to age or medical condition  Airway Management Planned: Oral ETT  Additional Equipment:   Intra-op Plan:   Post-operative Plan: Extubation in OR  Informed Consent: I have reviewed the patients History and Physical, chart, labs and discussed the procedure including the risks, benefits and alternatives for the proposed anesthesia with the patient or authorized representative who has indicated his/her understanding and acceptance.   Dental advisory given  Plan Discussed with: CRNA  Anesthesia Plan Comments:         Anesthesia Quick Evaluation

## 2017-04-06 NOTE — Progress Notes (Signed)
Date:  April 06 2017 Chart reviewed for concurrent status and case management needs.  Will continue to follow patient progress.  Discharge Planning: following for needs  Expected discharge date: April 09, 2017  Nakota Ackert, BSN, RN3, CCM   [phone]  

## 2017-04-06 NOTE — Progress Notes (Signed)
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    Anemia-hemoglobin 8.9 and stable; Type and Screen ordered.      CKD (chronic kidney disease), stage III (HCC)    Hearing loss    Plan:  Exploratory laparotomy, partial colectomy, removal of large cystic neoplasm today.  I have explained the procedure and risks of the operation.  Risks include but are not limited to bleeding, infection, wound problems, anesthesia, anastomotic leak, need for colostomy, need for reoperative surgery,  injury to intraabominal organs (such as intestine, spleen, kidney, bile duct, liver, bladder, ureter, etc.), ileus, and death.  She and her husband seem to understand and agree with the plan.   LOS: 6 days     4 Days Post-Op  Chief Complaint/Subjective: Comfortable and smiling.  Husband in room.  Objective: Vital signs in last 24 hours: Temp:  [99.5 F (37.5 C)-99.7 F (37.6 C)] 99.7 F (37.6 C) (10/29 0456) Pulse Rate:  [77-82] 77 (10/29 0456) Resp:  [16-20] 20 (10/29 0456) BP: (127-156)/(53-63) 127/53 (10/29 0456) SpO2:  [93 %-96 %] 93 % (10/29 0456) Weight:  [81.6 kg (179 lb 12.8 oz)] 81.6 kg (179 lb 12.8 oz) (10/29 0456) Last BM Date: 04/04/17  Intake/Output from previous day: 10/28 0701 - 10/29 0700 In: 586.7 [P.O.:240; I.V.:346.7] Out: -  Intake/Output this shift: No intake/output data recorded.  PE: General- In NAD.  Awake and alert. Abdomen-soft, obese, not tender, no scars, no palpable masses  Lab Results:   Recent Labs  04/05/17 0549 04/06/17 0553  WBC 5.6 4.8  HGB 8.8* 8.9*  HCT 27.8* 28.1*  PLT 289 243   BMET  Recent Labs  04/05/17 0549 04/06/17 0553  NA 142 140  K 4.1 4.1  CL 113* 109  CO2 20* 22  GLUCOSE 169* 95  BUN 8 9  CREATININE 1.98* 1.91*  CALCIUM 9.0 8.8*    PT/INR  Recent Labs  04/06/17 0553  LABPROT 14.6  INR 1.15   Comprehensive Metabolic Panel:    Component Value Date/Time   NA 140 04/06/2017 0553   NA 142 04/05/2017 0549   K 4.1 04/06/2017 0553   K 4.1 04/05/2017 0549   CL 109 04/06/2017 0553   CL 113 (H) 04/05/2017 0549   CO2 22 04/06/2017 0553   CO2 20 (L) 04/05/2017 0549   BUN 9 04/06/2017 0553   BUN 8 04/05/2017 0549   CREATININE 1.91 (H) 04/06/2017 0553   CREATININE 1.98 (H) 04/05/2017 0549   GLUCOSE 95 04/06/2017 0553   GLUCOSE 169 (H) 04/05/2017 0549   CALCIUM 8.8 (L) 04/06/2017 0553   CALCIUM 9.0 04/05/2017 0549   AST 15 04/01/2017 0856   AST 13 (L) 03/31/2017 1707   ALT 11 (L) 04/01/2017 0856   ALT 9 (L) 03/31/2017 1707   ALKPHOS 70 04/01/2017 0856   ALKPHOS 73 03/31/2017 1707   BILITOT 0.4 04/01/2017 0856   BILITOT 0.4 03/31/2017 1707   PROT 6.4 (L) 04/01/2017 0856   PROT 6.3 (L) 03/31/2017 1707   ALBUMIN 3.2 (L) 04/01/2017 0856   ALBUMIN 3.3 (L) 03/31/2017 1707     Studies/Results: No results found.  Anti-infectives: Anti-infectives    Start     Dose/Rate Route Frequency Ordered Stop   04/05/17 1400  neomycin (MYCIFRADIN) tablet 500 mg     500 mg  Oral Every 8 hours 04/05/17 1143 04/06/17 0608   04/05/17 1400  metroNIDAZOLE (FLAGYL) tablet 500 mg     500 mg Oral Every 8 hours 04/05/17 1143 04/06/17 0608   03/31/17 2000  cefTRIAXone (ROCEPHIN) 1 g in dextrose 5 % 50 mL IVPB     1 g 100 mL/hr over 30 Minutes Intravenous Every 24 hours 03/31/17 1957 04/05/17 2041       Keelan Tripodi J 04/06/2017

## 2017-04-07 DIAGNOSIS — H9190 Unspecified hearing loss, unspecified ear: Secondary | ICD-10-CM

## 2017-04-07 DIAGNOSIS — Z9049 Acquired absence of other specified parts of digestive tract: Secondary | ICD-10-CM

## 2017-04-07 LAB — CBC
HCT: 32 % — ABNORMAL LOW (ref 36.0–46.0)
Hemoglobin: 10.4 g/dL — ABNORMAL LOW (ref 12.0–15.0)
MCH: 26.2 pg (ref 26.0–34.0)
MCHC: 32.5 g/dL (ref 30.0–36.0)
MCV: 80.6 fL (ref 78.0–100.0)
PLATELETS: 216 10*3/uL (ref 150–400)
RBC: 3.97 MIL/uL (ref 3.87–5.11)
RDW: 18.1 % — AB (ref 11.5–15.5)
WBC: 9.5 10*3/uL (ref 4.0–10.5)

## 2017-04-07 LAB — TYPE AND SCREEN
ABO/RH(D): O POS
ANTIBODY SCREEN: NEGATIVE
UNIT DIVISION: 0
UNIT DIVISION: 0

## 2017-04-07 LAB — BPAM RBC
BLOOD PRODUCT EXPIRATION DATE: 201811282359
BLOOD PRODUCT EXPIRATION DATE: 201811282359
ISSUE DATE / TIME: 201810291409
ISSUE DATE / TIME: 201810291409
UNIT TYPE AND RH: 5100
Unit Type and Rh: 5100

## 2017-04-07 LAB — BASIC METABOLIC PANEL
ANION GAP: 9 (ref 5–15)
ANION GAP: 8 (ref 5–15)
BUN: 16 mg/dL (ref 6–20)
BUN: 15 mg/dL (ref 6–20)
CALCIUM: 8.4 mg/dL — AB (ref 8.9–10.3)
CALCIUM: 8.3 mg/dL — AB (ref 8.9–10.3)
CO2: 20 mmol/L — ABNORMAL LOW (ref 22–32)
CO2: 19 mmol/L — ABNORMAL LOW (ref 22–32)
CREATININE: 2.14 mg/dL — AB (ref 0.44–1.00)
Chloride: 117 mmol/L — ABNORMAL HIGH (ref 101–111)
Chloride: 119 mmol/L — ABNORMAL HIGH (ref 101–111)
Creatinine, Ser: 2.19 mg/dL — ABNORMAL HIGH (ref 0.44–1.00)
GFR calc Af Amer: 25 mL/min — ABNORMAL LOW (ref >60)
GFR, EST AFRICAN AMERICAN: 26 mL/min — AB (ref >60)
GFR, EST NON AFRICAN AMERICAN: 22 mL/min — AB (ref >60)
GFR, EST NON AFRICAN AMERICAN: 21 mL/min — AB (ref >60)
GLUCOSE: 184 mg/dL — AB (ref 65–99)
Glucose, Bld: 211 mg/dL — ABNORMAL HIGH (ref 65–99)
POTASSIUM: 4.2 mmol/L (ref 3.5–5.1)
POTASSIUM: 4 mmol/L (ref 3.5–5.1)
SODIUM: 146 mmol/L — AB (ref 135–145)
Sodium: 146 mmol/L — ABNORMAL HIGH (ref 135–145)

## 2017-04-07 MED ORDER — DIAZEPAM 5 MG PO TABS
5.0000 mg | ORAL_TABLET | Freq: Once | ORAL | Status: AC
  Administered 2017-04-07: 5 mg via ORAL
  Filled 2017-04-07: qty 1

## 2017-04-07 NOTE — Progress Notes (Signed)
Assessment Principal Problem:   Malignant neoplasm of ascending colon (HCC) and large cystic neoplasm in RUQ s/p exploratory laparotomy, right colectomy, removal of large cystic neoplasm 04/06/17-feels better this AM; HD stable    Chronic anemia-hemoglobin 10.4 today; received PRBCs during surgery.      CKD (chronic kidney disease), stage III (HCC)    Hearing loss    Plan:  Transfer to [facility]. Hold on Lovenox and if hemoglobin stable tomorrow will start it. Hold on diet for now.  LOS: 7 days     1 Day Post-Op  Chief Complaint/Subjective: Feels better this morning.  Less pain. Having some nausea. Husband in room.  We discussed the surgery.  Objective: Vital signs in last 24 hours: Temp:  [97.4 F (36.3 C)-99 F (37.2 C)] 97.9 F (36.6 C) (10/30 0348) Pulse Rate:  [88-96] 93 (10/29 1700) Resp:  [11-22] 11 (10/30 0327) BP: (136-168)/(66-85) 168/66 (10/30 0327) SpO2:  [92 %-98 %] 92 % (10/30 0000) Last BM Date: 04/04/17  Intake/Output from previous day: 10/29 0701 - 10/30 0700 In: 7345 [I.V.:6310; Blood:630; IV Piggyback:305] Out: 2805 [Urine:1790; Drains:115; Blood:900] Intake/Output this shift: No intake/output data recorded.  PE: General- In NAD.  Awake and alert. Abdomen-slightly firm and distended, hypoactive bowel sounds, some dried bloody drainage around wound, drain output is thin, serosanguinous fluid Lab Results:   Recent Labs  04/06/17 2150 04/07/17 0350  WBC 9.7 9.5  HGB 10.2* 10.4*  HCT 31.4* 32.0*  PLT 202 216   BMET  Recent Labs  04/06/17 0553 04/07/17 0350  NA 140 146*  K 4.1 4.2  CL 109 117*  CO2 22 20*  GLUCOSE 95 211*  BUN 9 16  CREATININE 1.91* 2.14*  CALCIUM 8.8* 8.4*   PT/INR  Recent Labs  04/06/17 0553  LABPROT 14.6  INR 1.15   Comprehensive Metabolic Panel:    Component Value Date/Time   NA 146 (H) 04/07/2017 0350   NA 140 04/06/2017 0553   K 4.2 04/07/2017 0350   K 4.1 04/06/2017 0553   CL 117 (H) 04/07/2017  0350   CL 109 04/06/2017 0553   CO2 20 (L) 04/07/2017 0350   CO2 22 04/06/2017 0553   BUN 16 04/07/2017 0350   BUN 9 04/06/2017 0553   CREATININE 2.14 (H) 04/07/2017 0350   CREATININE 1.91 (H) 04/06/2017 0553   GLUCOSE 211 (H) 04/07/2017 0350   GLUCOSE 95 04/06/2017 0553   CALCIUM 8.4 (L) 04/07/2017 0350   CALCIUM 8.8 (L) 04/06/2017 0553   AST 15 04/01/2017 0856   AST 13 (L) 03/31/2017 1707   ALT 11 (L) 04/01/2017 0856   ALT 9 (L) 03/31/2017 1707   ALKPHOS 70 04/01/2017 0856   ALKPHOS 73 03/31/2017 1707   BILITOT 0.4 04/01/2017 0856   BILITOT 0.4 03/31/2017 1707   PROT 6.4 (L) 04/01/2017 0856   PROT 6.3 (L) 03/31/2017 1707   ALBUMIN 3.2 (L) 04/01/2017 0856   ALBUMIN 3.3 (L) 03/31/2017 1707     Studies/Results: No results found.  Anti-infectives: Anti-infectives    Start     Dose/Rate Route Frequency Ordered Stop   04/06/17 2200  cefoTEtan (CEFOTAN) 2 g in dextrose 5 % 50 mL IVPB     2 g 100 mL/hr over 30 Minutes Intravenous Every 12 hours 04/06/17 1736 04/06/17 2230   04/06/17 1137  cefoTEtan in Dextrose 5% (CEFOTAN) 2-2.08 GM-% IVPB    Comments:  Harvell, Gwendolyn  : cabinet override      04/06/17 1137  04/06/17 2327   04/06/17 1030  cefoTEtan (CEFOTAN) 2 g in dextrose 5 % 50 mL IVPB     2 g 100 mL/hr over 30 Minutes Intravenous  Once 04/06/17 0931 04/06/17 1327   04/05/17 1400  neomycin (MYCIFRADIN) tablet 500 mg     500 mg Oral Every 8 hours 04/05/17 1143 04/06/17 0608   04/05/17 1400  metroNIDAZOLE (FLAGYL) tablet 500 mg     500 mg Oral Every 8 hours 04/05/17 1143 04/06/17 0608   03/31/17 2000  cefTRIAXone (ROCEPHIN) 1 g in dextrose 5 % 50 mL IVPB     1 g 100 mL/hr over 30 Minutes Intravenous Every 24 hours 03/31/17 1957 04/05/17 2041       Carless Slatten J 04/07/2017

## 2017-04-07 NOTE — Anesthesia Postprocedure Evaluation (Signed)
Anesthesia Post Note  Patient: Arti Trang  Procedure(s) Performed: OPEN RIGHT HEMICOLECTOMY AND RESECTION OF CYSTIC ABDOMINAL MASS (N/A )     Patient location during evaluation: PACU Anesthesia Type: General Level of consciousness: awake and alert Pain management: pain level controlled Vital Signs Assessment: post-procedure vital signs reviewed and stable Respiratory status: spontaneous breathing, nonlabored ventilation, respiratory function stable and patient connected to nasal cannula oxygen Cardiovascular status: blood pressure returned to baseline and stable Postop Assessment: no apparent nausea or vomiting Anesthetic complications: no    Last Vitals:  Vitals:   04/07/17 0800 04/07/17 0950  BP:    Pulse:    Resp:  10  Temp: (!) 36.3 C   SpO2:  94%                   Effie Berkshire

## 2017-04-07 NOTE — Progress Notes (Signed)
PROGRESS NOTE    Jasmine Cuevas  ERD:[id number] DOB: Aug 15, 1945 DOA: 03/31/2017 PCP: System, Pcp Not In   Brief Narrative:  71 yo F with hx of anemia, GERD presenting with fatigue, nausea, vomiting, vertigo found to have anemia and positive stool hemoccult.  Admitted for anemia, dizziness, N/V.    Found to have anemia to 5.8.  Colonoscopy with ulcerated friable proximal ascending colon mass.  Also with CT findings concerning for loculated cystic mas that appears to arise from undersurface of L lobe of liver.    Now s/p ex lap, right colectomy, and removal of cystic abdominal neoplasm.  Surgery now primary.   Assessment & Plan:   Principal Problem:   Anemia Active Problems:   Dizziness   Malignant neoplasm of ascending colon (HCC)   CKD (chronic kidney disease), stage III (HCC)   Hearing loss   Urinary tract infection   Cough   Metabolic acidosis   GERD (gastroesophageal reflux disease)   Thyroid nodule   LVH (left ventricular hypertrophy)   H/O hemicolectomy   Cancer of right colon ([place])  Invasive Colorectal Adenocarcinoma:  Colonoscopy with large ulcerated and friable proximal ascending colon mass.  Likely malignant.  Seen on colonoscopy and CT imaging.  Colonoscopy with malignant tumor in proximal ascending colon.  Biopsies pending from colonoscopy.   Oncology recommending outpatient follow up at cancer center Surgery consulted and now primary '[ ]'  follow up biopsy -> invasive colorectal adenocarcinoma  '[ ]'  CEA (4.8) and CA 19 9 (43)  S/p open laparotomy, R colectomy, and resection of cystic neoplasm on 10/29 '[ ]'  f/u surgical path  Complex Multi Loculated Cystic Mass:  Seen on undersurface of L lobe liver on CT imaging.  Surgery, oncology c/s as above.  MRI with 13 cm complex cystic mass which abuts inferior surface of liver, but does not appear to arise from within liver.   S/p open laparotomy, R colectomy, and resection of cystic neoplasm on 10/29 '[ ]'  f/u surgical  path  Anemia: microcytic.  5.8 on presentation, improved to 8.7 after transfusion.   Received additional 2 units pRBC yesterday with surgery.   Labs at admission including iron panel, ferritin, folate, ESR, SPEP, upep, TSH, celiac panel Low normal iron, low ferritin, will start iron B12 normal.  Folate normal. Normal TSH. Celiac panel with negative TTG, reticulin, and gliadin anitbodies  UPEP with normal urine immunofixation SPEP without evidence of monoclonal protein I don't see ESR ordered, but I don't think this will add much at this point Likely 2/2 colonic mass above.  Biopsies pending from colonoscopy (colorectal adenocarcinoma) and endoscopy (reactive gastropathy and focal erosion, no metaplasia, dysplasia, or malignancy) .   CTM H/H.    Nausea, vomiting, abdominal pain:  Suspect gastroenteritis as sx have resolved.  CT abdomen pelvis as above.     GI path panel and c diff negative.   Vertigo: notes hx of vertigo in past, sx improved with meclizine.  Not recurrent since she's been here.  She has hearing loss, but this is chronic, maybe worse over last week, though vertigo coincided with nausea and vomiting.  Will have work with PT/OT, pending evaluation. MRI notable for meningioma. Echo with normal systolic function, moderate to severe LVH, and trace MR carotid dopplers with 1-39% stenosis bilaterally, antegrade flow in vertebral arteries.  Troponin negative x 3.   EEG noted in admission note, but has not been completed yet. I don't think this is indicated at this time, will hold off for  now.  Hearing loss: chronic, but worse over past few weeks.  Exam today with cerumen impairing view of L TM, R TM with normal appearance. Recommend outpatient follow up  UTI F/u urine cx, greater than 100,000 klebsiella, f/u sensivities  Ceftriaxone 10/24 - 10/28  CKD stage III: creatinine has been stable around 1.7-1.8.  Slightly worse today to 2.19 post op. Continue to monitor.       FeNA  suggests intrinsic Urine without protein or RBC's urine eos negative  NAGMA: likely 2/2 renal disease, bicarb  Cough: she notes this is likely 2/2 her GERD.  Will start pepcid. Tessalon perles/guafenisen prn.   3 mm R lower lobe lung nodule  Thyroid nodules: seen incidentally on carotid US, outpatient f/u  Moderate to severe LVH on echo  No PCP, placed care management consult for PCP  DVT prophylaxis: SCD Code Status: Full  Family Communication: husband in room Disposition Plan: pending   Consultants:   GI, Dr. Paulita Fujita  Procedures: (Don't include imaging studies which can be auto populated. Include things that cannot be auto populated i.e. Echo, Carotid and venous dopplers, Foley, Bipap, HD, tubes/drains, wound vac, central lines etc)  Echo, carotid dopplers as above  Antimicrobials: (specify start and planned stop date. Auto populated tables are space occupying and do not give end dates)  ceftriaxone 10/24 - 28   Subjective: Doing ok post op.  Soreness.  No N/V.   Objective: Vitals:   04/07/17 0348 04/07/17 0800 04/07/17 0950 04/07/17 1200  BP:      Pulse:      Resp:   10   Temp: 97.9 F (36.6 C) (!) 97.4 F (36.3 C)  97.8 F (36.6 C)  TempSrc: Oral Oral  Axillary  SpO2:   94%   Weight:      Height:        Intake/Output Summary (Last 24 hours) at 04/07/17 1310 Last data filed at 04/07/17 1000  Gross per 24 hour  Intake             7645 ml  Output             3230 ml  Net             4415 ml   Filed Weights   04/04/17 0537 04/05/17 0449 04/06/17 0456  Weight: 83 kg (183 lb) 83.4 kg (183 lb 13.8 oz) 81.6 kg (179 lb 12.8 oz)    Examination:  General: No acute distress. Cardiovascular: Heart sounds show Rey Dansby regular rate, and rhythm. No gallops or rubs. No murmurs. No JVD. Lungs: Clear to auscultation bilaterally with good air movement. No rales, rhonchi or wheezes. Abdomen: Soft, appropriately tender, nondistended with normal active bowel sounds. No  masses. No hepatosplenomegaly.  Midline dressing Neurological: Alert and oriented 3. Moves all extremities 4 with equal strength. Cranial nerves II through XII grossly intact. Skin: Warm and dry. No rashes or lesions. Extremities: No clubbing or cyanosis. No edema. Pedal pulses 2+. Psychiatric: Mood and affect are normal. Insight and judgment are appropriate.  Data Reviewed: I have personally reviewed following labs and imaging studies  CBC:  Recent Labs Lab 03/31/17 1707  04/04/17 0610 04/05/17 0549 04/06/17 0553 04/06/17 2150 04/07/17 0350  WBC 9.3  < > 4.8 5.6 4.8 9.7 9.5  NEUTROABS 7.6  --   --   --   --   --   --   HGB 5.8*  < > 8.1* 8.8* 8.9* 10.2* 10.4*  HCT 18.9*  < >  25.9* 27.8* 28.1* 31.4* 32.0*  MCV 73.0*  < > 79.4 79.0 78.7 80.7 80.6  PLT 398  < > 296 289 243 202 216  < > = values in this interval not displayed. Basic Metabolic Panel:  Recent Labs Lab 04/04/17 0610 04/05/17 0549 04/06/17 0553 04/07/17 0350 04/07/17 0820  NA 142 142 140 146* 146*  K 4.1 4.1 4.1 4.2 4.0  CL 114* 113* 109 117* 119*  CO2 21* 20* 22 20* 19*  GLUCOSE 96 169* 95 211* 184*  BUN '9 8 9 16 15  ' CREATININE 1.86* 1.98* 1.91* 2.14* 2.19*  CALCIUM 8.8* 9.0 8.8* 8.4* 8.3*   GFR: Estimated Creatinine Clearance: 24.8 mL/min (Artemus Romanoff) (by C-G formula based on SCr of 2.19 mg/dL (H)). Liver Function Tests:  Recent Labs Lab 03/31/17 1707 04/01/17 0856  AST 13* 15  ALT 9* 11*  ALKPHOS 73 70  BILITOT 0.4 0.4  PROT 6.3* 6.4*  ALBUMIN 3.3* 3.2*    Recent Labs Lab 03/31/17 2030  LIPASE 29   No results for input(s): AMMONIA in the last 168 hours. Coagulation Profile:  Recent Labs Lab 03/31/17 1707 04/06/17 0553  INR 1.04 1.15   Cardiac Enzymes:  Recent Labs Lab 04/01/17 0413 04/01/17 0856 04/01/17 1436  TROPONINI <0.03 <0.03 <0.03   BNP (last 3 results) No results for input(s): PROBNP in the last 8760 hours. HbA1C:  Recent Labs  04/05/17 0542  HGBA1C 5.4    CBG: No results for input(s): GLUCAP in the last 168 hours. Lipid Profile: No results for input(s): CHOL, HDL, LDLCALC, TRIG, CHOLHDL, LDLDIRECT in the last 72 hours. Thyroid Function Tests: No results for input(s): TSH, T4TOTAL, FREET4, T3FREE, THYROIDAB in the last 72 hours. Anemia Panel: No results for input(s): VITAMINB12, FOLATE, FERRITIN, TIBC, IRON, RETICCTPCT in the last 72 hours. Sepsis Labs: No results for input(s): PROCALCITON, LATICACIDVEN in the last 168 hours.  Recent Results (from the past 240 hour(s))  Urine culture     Status: Abnormal   Collection Time: 03/31/17  9:07 PM  Result Value Ref Range Status   Specimen Description URINE, RANDOM  Final   Special Requests NONE  Final   Culture >=100,000 COLONIES/mL KLEBSIELLA PNEUMONIAE (Cordelro Gautreau)  Final   Report Status 04/03/2017 FINAL  Final   Organism ID, Bacteria KLEBSIELLA PNEUMONIAE (Marshall Kampf)  Final      Susceptibility   Klebsiella pneumoniae - MIC*    AMPICILLIN RESISTANT Resistant     CEFAZOLIN <=4 SENSITIVE Sensitive     CEFTRIAXONE <=1 SENSITIVE Sensitive     CIPROFLOXACIN <=0.25 SENSITIVE Sensitive     GENTAMICIN <=1 SENSITIVE Sensitive     IMIPENEM 0.5 SENSITIVE Sensitive     NITROFURANTOIN <=16 SENSITIVE Sensitive     TRIMETH/SULFA <=20 SENSITIVE Sensitive     AMPICILLIN/SULBACTAM <=2 SENSITIVE Sensitive     PIP/TAZO <=4 SENSITIVE Sensitive     Extended ESBL NEGATIVE Sensitive     * >=100,000 COLONIES/mL KLEBSIELLA PNEUMONIAE  MRSA PCR Screening     Status: None   Collection Time: 03/31/17 10:14 PM  Result Value Ref Range Status   MRSA by PCR NEGATIVE NEGATIVE Final    Comment:        The GeneXpert MRSA Assay (FDA approved for NASAL specimens only), is one component of Crystalmarie Yasin comprehensive MRSA colonization surveillance program. It is not intended to diagnose MRSA infection nor to guide or monitor treatment for MRSA infections.   Gastrointestinal Panel by PCR , Stool     Status: None  Collection Time:  04/01/17  1:45 PM  Result Value Ref Range Status   Campylobacter species NOT DETECTED NOT DETECTED Final   Plesimonas shigelloides NOT DETECTED NOT DETECTED Final   Salmonella species NOT DETECTED NOT DETECTED Final   Yersinia enterocolitica NOT DETECTED NOT DETECTED Final   Vibrio species NOT DETECTED NOT DETECTED Final   Vibrio cholerae NOT DETECTED NOT DETECTED Final   Enteroaggregative E coli (EAEC) NOT DETECTED NOT DETECTED Final   Enteropathogenic E coli (EPEC) NOT DETECTED NOT DETECTED Final   Enterotoxigenic E coli (ETEC) NOT DETECTED NOT DETECTED Final   Shiga like toxin producing E coli (STEC) NOT DETECTED NOT DETECTED Final   Shigella/Enteroinvasive E coli (EIEC) NOT DETECTED NOT DETECTED Final   Cryptosporidium NOT DETECTED NOT DETECTED Final   Cyclospora cayetanensis NOT DETECTED NOT DETECTED Final   Entamoeba histolytica NOT DETECTED NOT DETECTED Final   Giardia lamblia NOT DETECTED NOT DETECTED Final   Adenovirus F40/41 NOT DETECTED NOT DETECTED Final   Astrovirus NOT DETECTED NOT DETECTED Final   Norovirus GI/GII NOT DETECTED NOT DETECTED Final   Rotavirus Lesli Issa NOT DETECTED NOT DETECTED Final   Sapovirus (I, II, IV, and V) NOT DETECTED NOT DETECTED Final  C difficile quick scan w PCR reflex     Status: None   Collection Time: 04/01/17  1:45 PM  Result Value Ref Range Status   C Diff antigen NEGATIVE NEGATIVE Final   C Diff toxin NEGATIVE NEGATIVE Final   C Diff interpretation No C. difficile detected.  Final  Surgical pcr screen     Status: None   Collection Time: 04/03/17  4:43 PM  Result Value Ref Range Status   MRSA, PCR NEGATIVE NEGATIVE Final   Staphylococcus aureus NEGATIVE NEGATIVE Final    Comment: (NOTE) The Xpert SA Assay (FDA approved for NASAL specimens in patients 56 years of age and older), is one component of Auden Wettstein comprehensive surveillance program. It is not intended to diagnose infection nor to guide or monitor treatment.          Radiology  Studies: No results found.      Scheduled Meds: . alvimopan  12 mg Oral BID  . pantoprazole (PROTONIX) IV  40 mg Intravenous Q24H   Continuous Infusions: . dextrose 5 % and 0.9% NaCl 100 mL/hr at 04/07/17 0600  . methocarbamol (ROBAXIN)  IV Stopped (04/07/17 0630)     LOS: 7 days    Time spent: over 15 minutes    Fayrene Helper, MD [facility] Pager [phone]  If 7PM-7AM, please contact night-coverage [email or web address] Password [email or web address] 04/07/2017, 1:10 PM

## 2017-04-08 LAB — CBC
HCT: 28.3 % — ABNORMAL LOW (ref 36.0–46.0)
Hemoglobin: 9.3 g/dL — ABNORMAL LOW (ref 12.0–15.0)
MCH: 26.6 pg (ref 26.0–34.0)
MCHC: 32.9 g/dL (ref 30.0–36.0)
MCV: 81.1 fL (ref 78.0–100.0)
PLATELETS: 204 10*3/uL (ref 150–400)
RBC: 3.49 MIL/uL — AB (ref 3.87–5.11)
RDW: 19.5 % — ABNORMAL HIGH (ref 11.5–15.5)
WBC: 11 10*3/uL — AB (ref 4.0–10.5)

## 2017-04-08 LAB — BASIC METABOLIC PANEL
ANION GAP: 6 (ref 5–15)
BUN: 13 mg/dL (ref 6–20)
CO2: 20 mmol/L — ABNORMAL LOW (ref 22–32)
Calcium: 8.5 mg/dL — ABNORMAL LOW (ref 8.9–10.3)
Chloride: 121 mmol/L — ABNORMAL HIGH (ref 101–111)
Creatinine, Ser: 2.1 mg/dL — ABNORMAL HIGH (ref 0.44–1.00)
GFR calc Af Amer: 26 mL/min — ABNORMAL LOW (ref >60)
GFR, EST NON AFRICAN AMERICAN: 23 mL/min — AB (ref >60)
Glucose, Bld: 166 mg/dL — ABNORMAL HIGH (ref 65–99)
POTASSIUM: 3.9 mmol/L (ref 3.5–5.1)
SODIUM: 147 mmol/L — AB (ref 135–145)

## 2017-04-08 MED ORDER — METHOCARBAMOL 1000 MG/10ML IJ SOLN
500.0000 mg | Freq: Three times a day (TID) | INTRAMUSCULAR | Status: DC
  Administered 2017-04-08 – 2017-04-13 (×13): 500 mg via INTRAVENOUS
  Filled 2017-04-08 (×4): qty 550
  Filled 2017-04-08 (×2): qty 5
  Filled 2017-04-08 (×7): qty 550
  Filled 2017-04-08: qty 5
  Filled 2017-04-08: qty 550

## 2017-04-08 MED ORDER — KCL IN DEXTROSE-NACL 20-5-0.45 MEQ/L-%-% IV SOLN
INTRAVENOUS | Status: DC
  Administered 2017-04-08 – 2017-04-11 (×7): via INTRAVENOUS
  Filled 2017-04-08 (×6): qty 1000

## 2017-04-08 MED ORDER — POTASSIUM CHLORIDE 2 MEQ/ML IV SOLN: INTRAVENOUS | Status: DC

## 2017-04-08 MED ORDER — METHOCARBAMOL 500 MG PO TABS: 500.0000 mg | ORAL_TABLET | Freq: Three times a day (TID) | ORAL | Status: DC

## 2017-04-08 MED ORDER — BOOST / RESOURCE BREEZE PO LIQD: 1.0000 | Freq: Three times a day (TID) | ORAL | Status: DC

## 2017-04-08 NOTE — Plan of Care (Signed)
Problem: Safety: Goal: Ability to remain free from injury will improve Outcome: Progressing No safety concerns, safety precautions maintained  Problem: Pain Managment: Goal: General experience of comfort will improve Outcome: Progressing Medicated once for pain with moderate relief  Problem: Skin Integrity: Goal: Risk for impaired skin integrity will decrease Outcome: Progressing No skin issues noted, dressing intact to abdomen  Problem: Bowel/Gastric: Goal: Will not experience complications related to bowel motility Outcome: Progressing No bowel or gastric complications reported

## 2017-04-08 NOTE — Progress Notes (Signed)
PROGRESS NOTE    Jasmine Cuevas  FUX:[id number] DOB: 03/18/1946 DOA: 03/31/2017 PCP: System, Pcp Not In   Brief Narrative:  71 yo F with hx of anemia, GERD presenting with fatigue, nausea, vomiting, vertigo found to have anemia and positive stool hemoccult.  Admitted for anemia, dizziness, N/V. Found to have anemia to 5.8.  Colonoscopy with ulcerated friable proximal ascending colon mass.  Also with CT findings concerning for loculated cystic mas that appears to arise from undersurface of L lobe of liver. Patient underwent hemicolectomy. Pathology has revealed colonic adenocarcinoma. Surgery now primary.   Assessment & Plan:   Principal Problem:   Anemia Active Problems:   Dizziness   Malignant neoplasm of ascending colon (HCC)   CKD (chronic kidney disease), stage III (HCC)   Hearing loss   Urinary tract infection   Cough   Metabolic acidosis   GERD (gastroesophageal reflux disease)   Thyroid nodule   LVH (left ventricular hypertrophy)   H/O hemicolectomy   Cancer of right colon ([place])  Invasive Colorectal Adenocarcinoma:  Colonoscopy with large ulcerated and friable proximal ascending colon mass.  Likely malignant.  Seen on colonoscopy and CT imaging.  Colonoscopy with malignant tumor in proximal ascending colon.  Biopsies pending from colonoscopy.   Oncology recommending outpatient follow up at cancer center Surgery consulted and now primary '[ ]'  follow up biopsy -> invasive colorectal adenocarcinoma  '[ ]'  CEA (4.8) and CA 19 9 (43)  S/p open laparotomy, R colectomy, and resection of cystic neoplasm on 10/29 '[ ]'  f/u surgical path  Complex Multi Loculated Cystic Mass:  Seen on undersurface of L lobe liver on CT imaging.  Surgery, oncology c/s as above.  MRI with 13 cm complex cystic mass which abuts inferior surface of liver, but does not appear to arise from within liver.   S/p open laparotomy, R colectomy, and resection of cystic neoplasm on 10/29 '[ ]'  f/u surgical  path  Anemia: microcytic.  5.8 on presentation, improved to 8.7 after transfusion.   Received additional 2 units pRBC yesterday with surgery.   Labs at admission including iron panel, ferritin, folate, ESR, SPEP, upep, TSH, celiac panel Low normal iron, low ferritin, will start iron B12 normal.  Folate normal. Normal TSH. Celiac panel with negative TTG, reticulin, and gliadin anitbodies  UPEP with normal urine immunofixation SPEP without evidence of monoclonal protein I don't see ESR ordered, but I don't think this will add much at this point Likely 2/2 colonic mass above.  Biopsies pending from colonoscopy (colorectal adenocarcinoma) and endoscopy (reactive gastropathy and focal erosion, no metaplasia, dysplasia, or malignancy) .   CTM H/H.    Nausea, vomiting, abdominal pain:  Suspect gastroenteritis as sx have resolved.  CT abdomen pelvis as above.     GI path panel and c diff negative.   Vertigo: notes hx of vertigo in past, sx improved with meclizine.  Not recurrent since she's been here.  She has hearing loss, but this is chronic, maybe worse over last week, though vertigo coincided with nausea and vomiting.  Will have work with PT/OT, pending evaluation. MRI notable for meningioma. Echo with normal systolic function, moderate to severe LVH, and trace MR carotid dopplers with 1-39% stenosis bilaterally, antegrade flow in vertebral arteries.  Troponin negative x 3.   EEG noted in admission note, but has not been completed yet. I don't think this is indicated at this time, will hold off for now.  Hearing loss: chronic, but worse over past few weeks.  Exam today with cerumen impairing view of L TM, R TM with normal appearance. Recommend outpatient follow up  UTI F/u urine cx, greater than 100,000 klebsiella, f/u sensivities  Ceftriaxone 10/24 - 10/28  CKD stage III: creatinine has been stable around 1.7-1.8.  Slightly worse today to 2.19 post op. Continue to monitor.       FeNA  suggests intrinsic Urine without protein or RBC's urine eos negative  NAGMA: likely 2/2 renal disease, bicarb  Cough: she notes this is likely 2/2 her GERD.  Will start pepcid. Tessalon perles/guafenisen prn.   3 mm R lower lobe lung nodule  Thyroid nodules: seen incidentally on carotid US, outpatient f/u  Moderate to severe LVH on echo  No PCP, placed care management consult for PCP  DVT prophylaxis: SCD Code Status: Full  Family Communication: husband in room Disposition Plan: pending   Consultants:   GI, Dr. Paulita Fujita  Procedures: (Don't include imaging studies which can be auto populated. Include things that cannot be auto populated i.e. Echo, Carotid and venous dopplers, Foley, Bipap, HD, tubes/drains, wound vac, central lines etc)  Echo, carotid dopplers as above  Antimicrobials: (specify start and planned stop date. Auto populated tables are space occupying and do not give end dates)  ceftriaxone 10/24 - 28   Subjective: Doing ok post op.  Soreness.  No N/V.   Objective: Vitals:   04/07/17 2058 04/08/17 0228 04/08/17 0607 04/08/17 1458  BP: 140/71 (!) 144/71 (!) 161/79 (!) 150/76  Pulse: 78 79 85 80  Resp: '16 12 15 16  ' Temp: 98.9 F (37.2 C) 98.9 F (37.2 C) 98.8 F (37.1 C) 98.6 F (37 C)  TempSrc: Oral Oral Oral Oral  SpO2: 95% 94% 93% 95%  Weight:      Height:        Intake/Output Summary (Last 24 hours) at 04/08/17 1801 Last data filed at 04/08/17 1748  Gross per 24 hour  Intake              840 ml  Output             4590 ml  Net            -3750 ml   Filed Weights   04/04/17 0537 04/05/17 0449 04/06/17 0456  Weight: 83 kg (183 lb) 83.4 kg (183 lb 13.8 oz) 81.6 kg (179 lb 12.8 oz)    Examination:  General: No acute distress. Cardiovascular: Heart sounds show a regular rate, and rhythm. No gallops or rubs. No murmurs. No JVD. Lungs: Clear to auscultation bilaterally with good air movement. No rales, rhonchi or wheezes. Abdomen: Soft,  appropriately tender, nondistended with normal active bowel sounds. No masses. No hepatosplenomegaly.  Midline dressing Neurological: Alert and oriented 3. Moves all extremities 4 with equal strength. Cranial nerves II through XII grossly intact. Skin: Warm and dry. No rashes or lesions. Extremities: No clubbing or cyanosis. No edema. Pedal pulses 2+. Psychiatric: Mood and affect are normal. Insight and judgment are appropriate.  Data Reviewed: I have personally reviewed following labs and imaging studies  CBC:  Recent Labs Lab 04/05/17 0549 04/06/17 0553 04/06/17 2150 04/07/17 0350 04/08/17 0040  WBC 5.6 4.8 9.7 9.5 11.0*  HGB 8.8* 8.9* 10.2* 10.4* 9.3*  HCT 27.8* 28.1* 31.4* 32.0* 28.3*  MCV 79.0 78.7 80.7 80.6 81.1  PLT 289 243 202 216 882   Basic Metabolic Panel:  Recent Labs Lab 04/05/17 0549 04/06/17 0553 04/07/17 0350 04/07/17 0820 04/08/17 0040  NA 142  140 146* 146* 147*  K 4.1 4.1 4.2 4.0 3.9  CL 113* 109 117* 119* 121*  CO2 20* 22 20* 19* 20*  GLUCOSE 169* 95 211* 184* 166*  BUN '8 9 16 15 13  ' CREATININE 1.98* 1.91* 2.14* 2.19* 2.10*  CALCIUM 9.0 8.8* 8.4* 8.3* 8.5*   GFR: Estimated Creatinine Clearance: 25.9 mL/min (A) (by C-G formula based on SCr of 2.1 mg/dL (H)). Liver Function Tests: No results for input(s): AST, ALT, ALKPHOS, BILITOT, PROT, ALBUMIN in the last 168 hours. No results for input(s): LIPASE, AMYLASE in the last 168 hours. No results for input(s): AMMONIA in the last 168 hours. Coagulation Profile:  Recent Labs Lab 04/06/17 0553  INR 1.15   Cardiac Enzymes: No results for input(s): CKTOTAL, CKMB, CKMBINDEX, TROPONINI in the last 168 hours. BNP (last 3 results) No results for input(s): PROBNP in the last 8760 hours. HbA1C: No results for input(s): HGBA1C in the last 72 hours. CBG: No results for input(s): GLUCAP in the last 168 hours. Lipid Profile: No results for input(s): CHOL, HDL, LDLCALC, TRIG, CHOLHDL, LDLDIRECT in the  last 72 hours. Thyroid Function Tests: No results for input(s): TSH, T4TOTAL, FREET4, T3FREE, THYROIDAB in the last 72 hours. Anemia Panel: No results for input(s): VITAMINB12, FOLATE, FERRITIN, TIBC, IRON, RETICCTPCT in the last 72 hours. Sepsis Labs: No results for input(s): PROCALCITON, LATICACIDVEN in the last 168 hours.  Recent Results (from the past 240 hour(s))  Urine culture     Status: Abnormal   Collection Time: 03/31/17  9:07 PM  Result Value Ref Range Status   Specimen Description URINE, RANDOM  Final   Special Requests NONE  Final   Culture >=100,000 COLONIES/mL KLEBSIELLA PNEUMONIAE (A)  Final   Report Status 04/03/2017 FINAL  Final   Organism ID, Bacteria KLEBSIELLA PNEUMONIAE (A)  Final      Susceptibility   Klebsiella pneumoniae - MIC*    AMPICILLIN RESISTANT Resistant     CEFAZOLIN <=4 SENSITIVE Sensitive     CEFTRIAXONE <=1 SENSITIVE Sensitive     CIPROFLOXACIN <=0.25 SENSITIVE Sensitive     GENTAMICIN <=1 SENSITIVE Sensitive     IMIPENEM 0.5 SENSITIVE Sensitive     NITROFURANTOIN <=16 SENSITIVE Sensitive     TRIMETH/SULFA <=20 SENSITIVE Sensitive     AMPICILLIN/SULBACTAM <=2 SENSITIVE Sensitive     PIP/TAZO <=4 SENSITIVE Sensitive     Extended ESBL NEGATIVE Sensitive     * >=100,000 COLONIES/mL KLEBSIELLA PNEUMONIAE  MRSA PCR Screening     Status: None   Collection Time: 03/31/17 10:14 PM  Result Value Ref Range Status   MRSA by PCR NEGATIVE NEGATIVE Final    Comment:        The GeneXpert MRSA Assay (FDA approved for NASAL specimens only), is one component of a comprehensive MRSA colonization surveillance program. It is not intended to diagnose MRSA infection nor to guide or monitor treatment for MRSA infections.   Gastrointestinal Panel by PCR , Stool     Status: None   Collection Time: 04/01/17  1:45 PM  Result Value Ref Range Status   Campylobacter species NOT DETECTED NOT DETECTED Final   Plesimonas shigelloides NOT DETECTED NOT DETECTED Final    Salmonella species NOT DETECTED NOT DETECTED Final   Yersinia enterocolitica NOT DETECTED NOT DETECTED Final   Vibrio species NOT DETECTED NOT DETECTED Final   Vibrio cholerae NOT DETECTED NOT DETECTED Final   Enteroaggregative E coli (EAEC) NOT DETECTED NOT DETECTED Final   Enteropathogenic E coli (EPEC)  NOT DETECTED NOT DETECTED Final   Enterotoxigenic E coli (ETEC) NOT DETECTED NOT DETECTED Final   Shiga like toxin producing E coli (STEC) NOT DETECTED NOT DETECTED Final   Shigella/Enteroinvasive E coli (EIEC) NOT DETECTED NOT DETECTED Final   Cryptosporidium NOT DETECTED NOT DETECTED Final   Cyclospora cayetanensis NOT DETECTED NOT DETECTED Final   Entamoeba histolytica NOT DETECTED NOT DETECTED Final   Giardia lamblia NOT DETECTED NOT DETECTED Final   Adenovirus F40/41 NOT DETECTED NOT DETECTED Final   Astrovirus NOT DETECTED NOT DETECTED Final   Norovirus GI/GII NOT DETECTED NOT DETECTED Final   Rotavirus A NOT DETECTED NOT DETECTED Final   Sapovirus (I, II, IV, and V) NOT DETECTED NOT DETECTED Final  C difficile quick scan w PCR reflex     Status: None   Collection Time: 04/01/17  1:45 PM  Result Value Ref Range Status   C Diff antigen NEGATIVE NEGATIVE Final   C Diff toxin NEGATIVE NEGATIVE Final   C Diff interpretation No C. difficile detected.  Final  Surgical pcr screen     Status: None   Collection Time: 04/03/17  4:43 PM  Result Value Ref Range Status   MRSA, PCR NEGATIVE NEGATIVE Final   Staphylococcus aureus NEGATIVE NEGATIVE Final    Comment: (NOTE) The Xpert SA Assay (FDA approved for NASAL specimens in patients 59 years of age and older), is one component of a comprehensive surveillance program. It is not intended to diagnose infection nor to guide or monitor treatment.          Radiology Studies: No results found.      Scheduled Meds: . alvimopan  12 mg Oral BID  . feeding supplement  1 Container Oral TID BM  . methocarbamol  500 mg Oral Q8H  .  pantoprazole (PROTONIX) IV  40 mg Intravenous Q24H   Continuous Infusions: . dextrose 5 % and 0.45 % NaCl with KCl 20 mEq/L 100 mL/hr at 04/08/17 1014     LOS: 8 days    Time spent: over 15 minutes    Bonnell Public, MD [facility] Pager [phone]  If 7PM-7AM, please contact night-coverage [email or web address] Password [email or web address] 04/08/2017, 6:01 PM

## 2017-04-08 NOTE — Progress Notes (Signed)
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 Zella Richer  Chronic anemia - received 2U during surgery - 9.3 today down from 10.4 yesterday (10/30) - no lovenox until stable  CKD stage III - creatinine slightly up today, monitor, IVF  Medicine helping with co-morbidities, appreciate their assistance  FEN: clears, IVF VTE: SCD's, lovenox held for now until Hg stable ID: no ABX currently, WBC up slightly to 11, monitor Foley: none Follow up: Dr. Zella Richer  DISPO: good bowel sounds, clears, IVF, ambulate, PT pending, IS, hold on advancing diet until flatus    LOS: 8 days    Subjective: CC: abdominal pain  No nausea or vomiting. Pain improving. Husband at beside. Feeling better today. Hungry. No flautus or BM.  Objective: Vital signs in last 24 hours: Temp:  [97.8 F (36.6 C)-98.9 F (37.2 C)] 98.8 F (37.1 C) (10/31 0607) Pulse Rate:  [78-85] 85 (10/31 0607) Resp:  [12-16] 15 (10/31 0607) BP: (140-161)/(71-79) 161/79 (10/31 0607) SpO2:  [93 %-95 %] 93 % (10/31 0607) Last BM Date: 04/04/17  Intake/Output from previous day: 10/30 0701 - 10/31 0700 In: 1485 [P.O.:120; I.V.:1200; IV Piggyback:165] Out: 6237 [Urine:3875; Drains:165] Intake/Output this shift: Total I/O In: -  Out: 660 [Urine:600; Drains:60]  PE: Gen:  Alert, NAD, pleasant, cooperative Card:  RRR, no M/G/R heard Pulm:  CTA, no W/R/R, effort normal Abd: Soft, mild distention, +BS, incision C/D/I, drain with minimal serosanguinous drainage, generalized TTP without guarding Skin: no rashes noted, warm and dry   Anti-infectives: Anti-infectives    Start     Dose/Rate Route Frequency Ordered Stop   04/06/17 2200  cefoTEtan (CEFOTAN) 2 g in dextrose 5 % 50 mL IVPB     2 g 100 mL/hr over 30  Minutes Intravenous Every 12 hours 04/06/17 1736 04/06/17 2230   04/06/17 1137  cefoTEtan in Dextrose 5% (CEFOTAN) 2-2.08 GM-% IVPB    Comments:  Harvell, Gwendolyn  : cabinet override      04/06/17 1137 04/06/17 2327   04/06/17 1030  cefoTEtan (CEFOTAN) 2 g in dextrose 5 % 50 mL IVPB     2 g 100 mL/hr over 30 Minutes Intravenous  Once 04/06/17 0931 04/06/17 1327   04/05/17 1400  neomycin (MYCIFRADIN) tablet 500 mg     500 mg Oral Every 8 hours 04/05/17 1143 04/06/17 0608   04/05/17 1400  metroNIDAZOLE (FLAGYL) tablet 500 mg     500 mg Oral Every 8 hours 04/05/17 1143 04/06/17 0608   03/31/17 2000  cefTRIAXone (ROCEPHIN) 1 g in dextrose 5 % 50 mL IVPB     1 g 100 mL/hr over 30 Minutes Intravenous Every 24 hours 03/31/17 1957 04/05/17 2041      Lab Results:   Recent Labs  04/07/17 0350 04/08/17 0040  WBC 9.5 11.0*  HGB 10.4* 9.3*  HCT 32.0* 28.3*  PLT 216 204   BMET  Recent Labs  04/07/17 0820 04/08/17 0040  NA 146* 147*  K 4.0 3.9  CL 119* 121*  CO2 19* 20*  GLUCOSE 184* 166*  BUN 15 13  CREATININE 2.19* 2.10*  CALCIUM 8.3* 8.5*   PT/INR  Recent Labs  04/06/17 0553  LABPROT 14.6  INR 1.15   CMP     Component Value Date/Time   NA 147 (H) 04/08/2017 0040   K 3.9  04/08/2017 0040   CL 121 (H) 04/08/2017 0040   CO2 20 (L) 04/08/2017 0040   GLUCOSE 166 (H) 04/08/2017 0040   BUN 13 04/08/2017 0040   CREATININE 2.10 (H) 04/08/2017 0040   CALCIUM 8.5 (L) 04/08/2017 0040   PROT 6.4 (L) 04/01/2017 0856   ALBUMIN 3.2 (L) 04/01/2017 0856   AST 15 04/01/2017 0856   ALT 11 (L) 04/01/2017 0856   ALKPHOS 70 04/01/2017 0856   BILITOT 0.4 04/01/2017 0856   GFRNONAA 23 (L) 04/08/2017 0040   GFRAA 26 (L) 04/08/2017 0040   Lipase     Component Value Date/Time   LIPASE 29 03/31/2017 2030    Studies/Results: No results found.    Kalman Drape , [facility] 04/08/2017, 10:37 AM Pager: [phone] Consults: [phone] Mon-Fri 7:00  am-4:30 pm Sat-Sun 7:00 am-11:30 am

## 2017-04-08 NOTE — Progress Notes (Addendum)
Patients stating that she is having a lot of bloating and gas pain.  Stomach visibly distended.  Patients husband asked for something for bloating.  Spoke with Dr. Windle Guard who stated to give patient pain medication, start IV robaxin instead of PO. Make patient NPO and encourage ambulation. If pain and bloating continue then possible NG tube placement due to confirmed ileus.

## 2017-04-08 NOTE — Progress Notes (Signed)
Initial Nutrition Assessment  INTERVENTION:   -Diet advancement per MD -Will order Boost Breeze po TID, each supplement provides 250 kcal and 9 grams of protein -RD will continue to monitor  NUTRITION DIAGNOSIS:   Increased nutrient needs related to cancer and cancer related treatments, post-op healing as evidenced by estimated needs.  GOAL:   Patient will meet greater than or equal to 90% of their needs  MONITOR:   PO intake, Supplement acceptance, Labs, Weight trends, I & O's  REASON FOR ASSESSMENT:   LOS, NPO/Clear Liquid Diet    ASSESSMENT:   71 yo F with hx of anemia, GERD presenting with fatigue, nausea, vomiting, vertigo found to have anemia and positive stool hemoccult.  Admitted for anemia, dizziness, N/V.   10/29: s/p Exploratory laparotomy, right colectomy, removal of cystic abdominal neoplasm  Patient sleeping during RD visit. No family present at bedside. Pt reported she felt hungry today per surgery note. Noted 100%  Completion of clear liquids. Pt had some N/V PTA. No nausea today. Has been NPO or on a clear liquid diet for the past 8 days. Will order Boost Breeze to supplement diet while admitted.  Since admission pt has lost 6 lb.   Medications: IV Protonix daily, D5 and .45% NaCl w/ KCl infusion at 100 ml/hr -provides 408 kcal Labs reviewed: Elevated Na GFR: 23   NUTRITION - FOCUSED PHYSICAL EXAM:  Nutrition focused physical exam shows no sign of depletion of muscle mass or body fat.  Diet Order:  Diet clear liquid Room service appropriate? Yes; Fluid consistency: Thin  EDUCATION NEEDS:   No education needs have been identified at this time  Skin:  Skin Assessment: Skin Integrity Issues: Skin Integrity Issues:: Incisions Incisions: abdominal  Last BM:  10/27  Height:   Ht Readings from Last 1 Encounters:  04/02/17 5\' 5"  (1.651 m)    Weight:   Wt Readings from Last 1 Encounters:  04/06/17 179 lb 12.8 oz (81.6 kg)    Ideal Body  Weight:  56.8 kg  BMI:  Body mass index is 29.92 kg/m.  Estimated Nutritional Needs:   Kcal:  2000-2200  Protein:  90-100g  Fluid:  2L/day  Clayton Bibles, MS, RD, LDN [place] [facility] Pager: [phone] After Hours Pager: [phone]

## 2017-04-08 NOTE — Evaluation (Signed)
Physical Therapy Evaluation Patient Details Name: Jasmine Cuevas MRN: [id number] DOB: 02-Dec-1945 Today's Date: 04/08/2017   History of Present Illness  71 yo female admitted with anemia, N/V. Diagnosed with colorectal cancer, liver mass. S/P ex lap, R colectomy, resection cystic neoplasm 04/06/17    Clinical Impression  On eval, pt was Min guard assist for mobility. She walked ~200 feet in the hallway while pushing the IV pole. She tolerated activity well. Will follow and progress activity as tolerated. Recommend daily ambulation in hallways with nursing supervision as well. Do not anticipate any follow up PT needs at discharge.     Follow Up Recommendations No PT follow up    Equipment Recommendations  None recommended by PT    Recommendations for Other Services       Precautions / Restrictions Precautions Precautions: Other (comment) Precaution Comments: jp drain R abdomen Restrictions Weight Bearing Restrictions: No      Mobility  Bed Mobility Overal bed mobility: Needs Assistance Bed Mobility: Supine to Sit;Sit to Supine     Supine to sit: HOB elevated;Supervision Sit to supine: HOB elevated;Supervision   General bed mobility comments: cues for technique.   Transfers Overall transfer level: Needs assistance   Transfers: Sit to/from Stand Sit to Stand: Supervision         General transfer comment: for safety  Ambulation/Gait Ambulation/Gait assistance: Min guard Ambulation Distance (Feet): 200 Feet Assistive device:  (IV pole) Gait Pattern/deviations: Step-through pattern     General Gait Details: close guard for safety.   Stairs            Wheelchair Mobility    Modified Rankin (Stroke Patients Only)       Balance                                             Pertinent Vitals/Pain Pain Assessment: Faces Faces Pain Scale: Hurts little more Pain Location: abdomen with activity Pain Descriptors / Indicators:  Sore Pain Intervention(s): Monitored during session    Home Living Family/patient expects to be discharged to:: Private residence Living Arrangements: Spouse/significant other Available Help at Discharge: Family Type of Home: Apartment Home Access: Elevator     Home Layout: One level Home Equipment: None      Prior Function                 Hand Dominance        Extremity/Trunk Assessment   Upper Extremity Assessment Upper Extremity Assessment: Overall WFL for tasks assessed    Lower Extremity Assessment Lower Extremity Assessment: Overall WFL for tasks assessed    Cervical / Trunk Assessment Cervical / Trunk Assessment: Normal  Communication   Communication: HOH  Cognition Arousal/Alertness: Awake/alert Behavior During Therapy: WFL for tasks assessed/performed Overall Cognitive Status: Within Functional Limits for tasks assessed                                        General Comments      Exercises     Assessment/Plan    PT Assessment Patient needs continued PT services  PT Problem List         PT Treatment Interventions Gait training;Therapeutic activities;Therapeutic exercise;Patient/family education;Functional mobility training    PT Goals (Current goals can be found in the Care Plan section)  Acute Rehab PT Goals Patient Stated Goal: regain PLOF PT Goal Formulation: With patient Time For Goal Achievement: 04/22/17 Potential to Achieve Goals: Good    Frequency Min 3X/week   Barriers to discharge        Co-evaluation               AM-PAC PT "6 Clicks" Daily Activity  Outcome Measure Difficulty turning over in bed (including adjusting bedclothes, sheets and blankets)?: A Little Difficulty moving from lying on back to sitting on the side of the bed? : A Little Difficulty sitting down on and standing up from a chair with arms (e.g., wheelchair, bedside commode, etc,.)?: A Little Help needed moving to and from a  bed to chair (including a wheelchair)?: A Little Help needed walking in hospital room?: A Little Help needed climbing 3-5 steps with a railing? : A Little 6 Click Score: 18    End of Session   Activity Tolerance: Patient tolerated treatment well Patient left: in bed;with call bell/phone within reach;with family/visitor present   PT Visit Diagnosis: Pain;Difficulty in walking, not elsewhere classified (R26.2) Pain - part of body:  (abdomen)    Time: 8315-1761 PT Time Calculation (min) (ACUTE ONLY): 14 min   Charges:   PT Evaluation $PT Eval Low Complexity: 1 Low     PT G Codes:          Weston Anna, MPT Pager: [phone]

## 2017-04-09 LAB — BASIC METABOLIC PANEL
Anion gap: 7 (ref 5–15)
BUN: 9 mg/dL (ref 6–20)
CHLORIDE: 119 mmol/L — AB (ref 101–111)
CO2: 19 mmol/L — AB (ref 22–32)
CREATININE: 1.96 mg/dL — AB (ref 0.44–1.00)
Calcium: 8.5 mg/dL — ABNORMAL LOW (ref 8.9–10.3)
GFR calc Af Amer: 28 mL/min — ABNORMAL LOW (ref >60)
GFR calc non Af Amer: 25 mL/min — ABNORMAL LOW (ref >60)
GLUCOSE: 122 mg/dL — AB (ref 65–99)
Potassium: 3.7 mmol/L (ref 3.5–5.1)
Sodium: 145 mmol/L (ref 135–145)

## 2017-04-09 LAB — CBC
HEMATOCRIT: 29.5 % — AB (ref 36.0–46.0)
Hemoglobin: 9.2 g/dL — ABNORMAL LOW (ref 12.0–15.0)
MCH: 25.8 pg — AB (ref 26.0–34.0)
MCHC: 31.2 g/dL (ref 30.0–36.0)
MCV: 82.6 fL (ref 78.0–100.0)
Platelets: 213 10*3/uL (ref 150–400)
RBC: 3.57 MIL/uL — ABNORMAL LOW (ref 3.87–5.11)
RDW: 19.6 % — AB (ref 11.5–15.5)
WBC: 9.5 10*3/uL (ref 4.0–10.5)

## 2017-04-09 MED ORDER — ENOXAPARIN SODIUM 30 MG/0.3ML ~~LOC~~ SOLN
30.0000 mg | SUBCUTANEOUS | Status: DC
  Administered 2017-04-09 – 2017-04-10 (×2): 30 mg via SUBCUTANEOUS
  Filled 2017-04-09 (×2): qty 0.3

## 2017-04-09 MED ORDER — PANTOPRAZOLE SODIUM 40 MG PO TBEC
40.0000 mg | DELAYED_RELEASE_TABLET | Freq: Every day | ORAL | Status: DC
  Administered 2017-04-09 – 2017-04-12 (×4): 40 mg via ORAL
  Filled 2017-04-09 (×4): qty 1

## 2017-04-09 MED ORDER — ACETAMINOPHEN 325 MG PO TABS: 650.0000 mg | ORAL_TABLET | Freq: Four times a day (QID) | ORAL | Status: DC | PRN

## 2017-04-09 NOTE — Progress Notes (Signed)
Consult PROGRESS NOTE    Jasmine Cuevas  WUJ:[id number] DOB: 1945/09/21 DOA: 03/31/2017 PCP: System, Pcp Not In   Brief Narrative: 71 year old female with history of GERD, chronic anemia presented with nausea vomiting and positive stool Hemoccult. Found to have hemoglobin of 5.8 on admission. Colonoscopy with ulcerated friable proximal ascending colon mass and CT scan showed loculated cystic mass arising from the undersurface of left lobe of the liver. Patient underwent hemicolectomy and consistent with colonic adenocarcinoma.  Assessment & Plan:   #Malignant new plasma of ascending colon and large cystic and a plasma in right upper quadrant: -s/p exploratory laparotomy, right colectomy, removal of large cystic neoplasm 10/29 -Adenocarcinoma of the colon, recommended outpatient follow-up with oncology. -Patient has a drain  - starting liquid diet today as per surgery team. -On Protonix IV  #Normocytic anemia: Hemoglobin is stable. No sign of bleeding. The patient received red blood cell transfusion during surgery.  #Chronic kidney disease stage III: Serum creatinine level is stable. Monitor BMP. Avoid nephrotoxins.  #Nausea vomiting and abdominal pain: Improving.  #Urinary tract infection lower: Received ceftriaxone.  #Incidental finding of a 3 mm right lower lobe lung node: Recommended outpatient follow-up.  DVT prophylaxis: Lovenox subcutaneous Code Status: Full code Family Communication: Discussed with the patient's husband at bedside Disposition Plan: Currently admitted  Subjective: Denied nausea vomiting or abdominal pain. Husband bedside. Has generalized weakness. No chest pain or shortness of breath.  Objective: Vitals:   04/08/17 0607 04/08/17 1458 04/08/17 2114 04/09/17 0443  BP: (!) 161/79 (!) 150/76 (!) 164/71 (!) 165/79  Pulse: 85 80 76 76  Resp: 15 16 16 18   Temp: 98.8 F (37.1 C) 98.6 F (37 C) 98.4 F (36.9 C) 98.3 F (36.8 C)  TempSrc: Oral Oral Oral  Oral  SpO2: 93% 95% 95% 96%  Weight:      Height:        Intake/Output Summary (Last 24 hours) at 04/09/17 1203 Last data filed at 04/09/17 1200  Gross per 24 hour  Intake          2451.67 ml  Output             4335 ml  Net         -1883.33 ml   Filed Weights   04/04/17 0537 04/05/17 0449 04/06/17 0456  Weight: 83 kg (183 lb) 83.4 kg (183 lb 13.8 oz) 81.6 kg (179 lb 12.8 oz)    Examination:  General exam: Appears calm and comfortable  Respiratory system: Clear to auscultation. Respiratory effort normal. No wheezing or crackle Cardiovascular system: S1 & S2 heard, RRR.  No pedal edema. Gastrointestinal system: Has drain, soft, surgical site with dressing. Bowel sound positive Central nervous system: Alert and oriented. No focal neurological deficits. Skin: No rashes, lesions or ulcers Psychiatry: Judgement and insight appear normal. Mood & affect appropriate.     Data Reviewed: I have personally reviewed following labs and imaging studies  CBC:  Recent Labs Lab 04/06/17 0553 04/06/17 2150 04/07/17 0350 04/08/17 0040 04/09/17 0451  WBC 4.8 9.7 9.5 11.0* 9.5  HGB 8.9* 10.2* 10.4* 9.3* 9.2*  HCT 28.1* 31.4* 32.0* 28.3* 29.5*  MCV 78.7 80.7 80.6 81.1 82.6  PLT 243 202 216 204 956   Basic Metabolic Panel:  Recent Labs Lab 04/06/17 0553 04/07/17 0350 04/07/17 0820 04/08/17 0040 04/09/17 0451  NA 140 146* 146* 147* 145  K 4.1 4.2 4.0 3.9 3.7  CL 109 117* 119* 121* 119*  CO2 22 20* 19* 20*  19*  GLUCOSE 95 211* 184* 166* 122*  BUN 9 16 15 13 9   CREATININE 1.91* 2.14* 2.19* 2.10* 1.96*  CALCIUM 8.8* 8.4* 8.3* 8.5* 8.5*   GFR: Estimated Creatinine Clearance: 27.8 mL/min (A) (by C-G formula based on SCr of 1.96 mg/dL (H)). Liver Function Tests: No results for input(s): AST, ALT, ALKPHOS, BILITOT, PROT, ALBUMIN in the last 168 hours. No results for input(s): LIPASE, AMYLASE in the last 168 hours. No results for input(s): AMMONIA in the last 168  hours. Coagulation Profile:  Recent Labs Lab 04/06/17 0553  INR 1.15   Cardiac Enzymes: No results for input(s): CKTOTAL, CKMB, CKMBINDEX, TROPONINI in the last 168 hours. BNP (last 3 results) No results for input(s): PROBNP in the last 8760 hours. HbA1C: No results for input(s): HGBA1C in the last 72 hours. CBG: No results for input(s): GLUCAP in the last 168 hours. Lipid Profile: No results for input(s): CHOL, HDL, LDLCALC, TRIG, CHOLHDL, LDLDIRECT in the last 72 hours. Thyroid Function Tests: No results for input(s): TSH, T4TOTAL, FREET4, T3FREE, THYROIDAB in the last 72 hours. Anemia Panel: No results for input(s): VITAMINB12, FOLATE, FERRITIN, TIBC, IRON, RETICCTPCT in the last 72 hours. Sepsis Labs: No results for input(s): PROCALCITON, LATICACIDVEN in the last 168 hours.  Recent Results (from the past 240 hour(s))  Urine culture     Status: Abnormal   Collection Time: 03/31/17  9:07 PM  Result Value Ref Range Status   Specimen Description URINE, RANDOM  Final   Special Requests NONE  Final   Culture >=100,000 COLONIES/mL KLEBSIELLA PNEUMONIAE (A)  Final   Report Status 04/03/2017 FINAL  Final   Organism ID, Bacteria KLEBSIELLA PNEUMONIAE (A)  Final      Susceptibility   Klebsiella pneumoniae - MIC*    AMPICILLIN RESISTANT Resistant     CEFAZOLIN <=4 SENSITIVE Sensitive     CEFTRIAXONE <=1 SENSITIVE Sensitive     CIPROFLOXACIN <=0.25 SENSITIVE Sensitive     GENTAMICIN <=1 SENSITIVE Sensitive     IMIPENEM 0.5 SENSITIVE Sensitive     NITROFURANTOIN <=16 SENSITIVE Sensitive     TRIMETH/SULFA <=20 SENSITIVE Sensitive     AMPICILLIN/SULBACTAM <=2 SENSITIVE Sensitive     PIP/TAZO <=4 SENSITIVE Sensitive     Extended ESBL NEGATIVE Sensitive     * >=100,000 COLONIES/mL KLEBSIELLA PNEUMONIAE  MRSA PCR Screening     Status: None   Collection Time: 03/31/17 10:14 PM  Result Value Ref Range Status   MRSA by PCR NEGATIVE NEGATIVE Final    Comment:        The GeneXpert  MRSA Assay (FDA approved for NASAL specimens only), is one component of a comprehensive MRSA colonization surveillance program. It is not intended to diagnose MRSA infection nor to guide or monitor treatment for MRSA infections.   Gastrointestinal Panel by PCR , Stool     Status: None   Collection Time: 04/01/17  1:45 PM  Result Value Ref Range Status   Campylobacter species NOT DETECTED NOT DETECTED Final   Plesimonas shigelloides NOT DETECTED NOT DETECTED Final   Salmonella species NOT DETECTED NOT DETECTED Final   Yersinia enterocolitica NOT DETECTED NOT DETECTED Final   Vibrio species NOT DETECTED NOT DETECTED Final   Vibrio cholerae NOT DETECTED NOT DETECTED Final   Enteroaggregative E coli (EAEC) NOT DETECTED NOT DETECTED Final   Enteropathogenic E coli (EPEC) NOT DETECTED NOT DETECTED Final   Enterotoxigenic E coli (ETEC) NOT DETECTED NOT DETECTED Final   Shiga like toxin producing E coli (  STEC) NOT DETECTED NOT DETECTED Final   Shigella/Enteroinvasive E coli (EIEC) NOT DETECTED NOT DETECTED Final   Cryptosporidium NOT DETECTED NOT DETECTED Final   Cyclospora cayetanensis NOT DETECTED NOT DETECTED Final   Entamoeba histolytica NOT DETECTED NOT DETECTED Final   Giardia lamblia NOT DETECTED NOT DETECTED Final   Adenovirus F40/41 NOT DETECTED NOT DETECTED Final   Astrovirus NOT DETECTED NOT DETECTED Final   Norovirus GI/GII NOT DETECTED NOT DETECTED Final   Rotavirus A NOT DETECTED NOT DETECTED Final   Sapovirus (I, II, IV, and V) NOT DETECTED NOT DETECTED Final  C difficile quick scan w PCR reflex     Status: None   Collection Time: 04/01/17  1:45 PM  Result Value Ref Range Status   C Diff antigen NEGATIVE NEGATIVE Final   C Diff toxin NEGATIVE NEGATIVE Final   C Diff interpretation No C. difficile detected.  Final  Surgical pcr screen     Status: None   Collection Time: 04/03/17  4:43 PM  Result Value Ref Range Status   MRSA, PCR NEGATIVE NEGATIVE Final    Staphylococcus aureus NEGATIVE NEGATIVE Final    Comment: (NOTE) The Xpert SA Assay (FDA approved for NASAL specimens in patients 26 years of age and older), is one component of a comprehensive surveillance program. It is not intended to diagnose infection nor to guide or monitor treatment.          Radiology Studies: No results found.      Scheduled Meds: . alvimopan  12 mg Oral BID  . enoxaparin (LOVENOX) injection  30 mg Subcutaneous Q24H  . pantoprazole (PROTONIX) IV  40 mg Intravenous Q24H   Continuous Infusions: . dextrose 5 % and 0.45 % NaCl with KCl 20 mEq/L 100 mL/hr at 04/09/17 0618  . methocarbamol (ROBAXIN)  IV Stopped (04/09/17 5320)     LOS: 9 days    Drisana Schweickert Tanna Furry, MD [facility] Pager [phone]  If 7PM-7AM, please contact night-coverage [email or web address] Password [email or web address] 04/09/2017, 12:03 PM

## 2017-04-09 NOTE — Progress Notes (Signed)
Physical Therapy Treatment Patient Details Name: Jasmine Cuevas MRN: [id number] DOB: 25-Dec-1945 Today's Date: 04/09/2017    History of Present Illness 71 yo female admitted with anemia, N/V. Diagnosed with colorectal cancer, liver mass. S/P ex lap, R colectomy, resection cystic neoplasm 04/06/17    PT Comments    Progressing with mobility. Pt experiencing increased pain on today so she required a little more assistance. She continues to mobilize well. Will follow during hospital stay.    Follow Up Recommendations  No PT follow up     Equipment Recommendations  None recommended by PT    Recommendations for Other Services       Precautions / Restrictions Precautions Precautions: Other (comment) Precaution Comments: jp drain R abdomen. HOH. Restrictions Weight Bearing Restrictions: No    Mobility  Bed Mobility Overal bed mobility: Needs Assistance Bed Mobility: Supine to Sit;Sit to Supine     Supine to sit: HOB elevated;Supervision Sit to supine: Min assist;HOB elevated   General bed mobility comments: Small amount of assist for LEs on today.   Transfers Overall transfer level: Needs assistance   Transfers: Sit to/from Stand Sit to Stand: Min assist         General transfer comment: 1 HHA to stand  Ambulation/Gait Ambulation/Gait assistance: Min guard Ambulation Distance (Feet): 500 Feet Assistive device: None (IV pole) Gait Pattern/deviations: Step-through pattern;Decreased stride length     General Gait Details: pt walked ~250 feet while pushing IV pole and ~250 feet without any external support. close guard for safety   Stairs            Wheelchair Mobility    Modified Rankin (Stroke Patients Only)       Balance                                            Cognition Arousal/Alertness: Awake/alert Behavior During Therapy: WFL for tasks assessed/performed Overall Cognitive Status: Within Functional Limits for tasks  assessed                                        Exercises      General Comments        Pertinent Vitals/Pain Pain Assessment: 0-10 Pain Score: 6  Pain Location: abdomen with activity Pain Descriptors / Indicators: Sore;Grimacing;Guarding Pain Intervention(s): Monitored during session    Home Living                      Prior Function            PT Goals (current goals can now be found in the care plan section) Progress towards PT goals: Progressing toward goals    Frequency    Min 3X/week      PT Plan Current plan remains appropriate    Co-evaluation              AM-PAC PT "6 Clicks" Daily Activity  Outcome Measure  Difficulty turning over in bed (including adjusting bedclothes, sheets and blankets)?: A Little Difficulty moving from lying on back to sitting on the side of the bed? : A Little Difficulty sitting down on and standing up from a chair with arms (e.g., wheelchair, bedside commode, etc,.)?: A Little Help needed moving to and from a bed to chair (including  a wheelchair)?: A Little Help needed walking in hospital room?: A Little Help needed climbing 3-5 steps with a railing? : A Little 6 Click Score: 18    End of Session   Activity Tolerance: Patient limited by pain Patient left: with call bell/phone within reach;in bed;with family/visitor present   PT Visit Diagnosis: Difficulty in walking, not elsewhere classified (R26.2) Pain - part of body:  (abdomen)     Time: 4801-6553 PT Time Calculation (min) (ACUTE ONLY): 13 min  Charges:  $Gait Training: 8-22 mins                    G Codes:          Weston Anna, MPT Pager: [phone]

## 2017-04-09 NOTE — Progress Notes (Signed)
[facility]/Trauma Progress Note  3 Days Post-Op   Assessment/Plan Malignant neoplasm of ascending colon (HCC) and large cystic neoplasm in RUQ - s/p exploratory laparotomy, right colectomy, removal of large cystic neoplasm 10/29, Dr. Zella Richer - Adenocarcinoma of colon, 0 lymph nodes involved. Cyst was benign - high drain output  Chronic anemia - received 2U during surgery - 9.2 today from 9.3  CKD stage III - creatinine trending down, monitor, IVF  Medicine helping with co-morbidities, appreciate their assistance  FEN: clears, IVF VTE: SCD's, lovenox start today ID: no ABX currently, WBC up slightly to 11, monitor Foley: none Follow up: Dr. Zella Richer  DISPO: good bowel sounds, fulls, IVF, ambulate, IS, hold on advancing diet until flatus, SCD's, monitor high drain output   LOS: 9 days    Subjective: CC: abdominal pain  Pt thinks she had flatus but not 100% sure. Pt has not needed pain medicine since yesterday morning. Has been out of bed walking. No nausea or vomiting. No fever or chills. Husband and bedside. Pt was not wearing SCD's and when asked why, pt's husband said they were told she didn't need them. I explained that she did and she needed them on at all times when in bed.   Objective: Vital signs in last 24 hours: Temp:  [98.3 F (36.8 C)-98.6 F (37 C)] 98.3 F (36.8 C) (11/01 0443) Pulse Rate:  [76-80] 76 (11/01 0443) Resp:  [16-18] 18 (11/01 0443) BP: (150-165)/(71-79) 165/79 (11/01 0443) SpO2:  [95 %-96 %] 96 % (11/01 0443) Last BM Date: 04/04/17  Intake/Output from previous day: 10/31 0701 - 11/01 0700 In: 2371.7 [P.O.:840; I.V.:1476.7; IV Piggyback:55] Out: 7035 [Urine:3550; Drains:385] Intake/Output this shift: Total I/O In: -  Out: 500 [Urine:500]  PE: Gen:  Alert, NAD, pleasant, cooperative Card:  RRR, no M/G/R heard Pulm:  CTA, no W/R/R, effort normal Abd: Soft, mild distention, +BS, incision C/D/I, drain with  serosanguinous drainage, generalized TTP without guarding Skin: no rashes noted, warm and dry   Anti-infectives: Anti-infectives    Start     Dose/Rate Route Frequency Ordered Stop   04/06/17 2200  cefoTEtan (CEFOTAN) 2 g in dextrose 5 % 50 mL IVPB     2 g 100 mL/hr over 30 Minutes Intravenous Every 12 hours 04/06/17 1736 04/06/17 2230   04/06/17 1137  cefoTEtan in Dextrose 5% (CEFOTAN) 2-2.08 GM-% IVPB    Comments:  Harvell, Gwendolyn  : cabinet override      04/06/17 1137 04/06/17 2327   04/06/17 1030  cefoTEtan (CEFOTAN) 2 g in dextrose 5 % 50 mL IVPB     2 g 100 mL/hr over 30 Minutes Intravenous  Once 04/06/17 0931 04/06/17 1327   04/05/17 1400  neomycin (MYCIFRADIN) tablet 500 mg     500 mg Oral Every 8 hours 04/05/17 1143 04/06/17 0608   04/05/17 1400  metroNIDAZOLE (FLAGYL) tablet 500 mg     500 mg Oral Every 8 hours 04/05/17 1143 04/06/17 0608   03/31/17 2000  cefTRIAXone (ROCEPHIN) 1 g in dextrose 5 % 50 mL IVPB     1 g 100 mL/hr over 30 Minutes Intravenous Every 24 hours 03/31/17 1957 04/05/17 2041      Lab Results:   Recent Labs  04/08/17 0040 04/09/17 0451  WBC 11.0* 9.5  HGB 9.3* 9.2*  HCT 28.3* 29.5*  PLT 204 213   BMET  Recent Labs  04/08/17 0040 04/09/17 0451  NA 147* 145  K 3.9 3.7  CL 121* 119*  CO2 20* 19*  GLUCOSE 166* 122*  BUN 13 9  CREATININE 2.10* 1.96*  CALCIUM 8.5* 8.5*   PT/INR No results for input(s): LABPROT, INR in the last 72 hours. CMP     Component Value Date/Time   NA 145 04/09/2017 0451   K 3.7 04/09/2017 0451   CL 119 (H) 04/09/2017 0451   CO2 19 (L) 04/09/2017 0451   GLUCOSE 122 (H) 04/09/2017 0451   BUN 9 04/09/2017 0451   CREATININE 1.96 (H) 04/09/2017 0451   CALCIUM 8.5 (L) 04/09/2017 0451   PROT 6.4 (L) 04/01/2017 0856   ALBUMIN 3.2 (L) 04/01/2017 0856   AST 15 04/01/2017 0856   ALT 11 (L) 04/01/2017 0856   ALKPHOS 70 04/01/2017 0856   BILITOT 0.4 04/01/2017 0856   GFRNONAA 25 (L) 04/09/2017 0451   GFRAA  28 (L) 04/09/2017 0451   Lipase     Component Value Date/Time   LIPASE 29 03/31/2017 2030    Studies/Results: No results found.    [facility] Drape [facility] Surgery 04/09/2017, 9:28 AM Pager: [phone] Consults: [phone] Mon-Fri 7:00 am-4:30 pm Sat-Sun 7:00 am-11:30 am

## 2017-04-09 NOTE — Progress Notes (Signed)
Pharmacy IV to PO conversion  The patient is receiving Pantoprazole by the intravenous route.  Based on criteria approved by the Pharmacy and [place], the medication is being converted to the equivalent oral dose form.   No active GI bleeding or impaired absorption  Not s/p esophagectomy  Documented ability to take oral medications for > 24 hr  Plan to continue treatment for at least 1 day  If you have any questions about this conversion, please contact the Pharmacy Department (ext [phone]).  Thank you.  Reuel Boom, PharmD Pager: [phone] 04/09/2017, 1:54 PM

## 2017-04-09 NOTE — Progress Notes (Signed)
Operative findings and pathology report noted.  I had a brief discussion with Ms. Escher regarding the pathology.  We will schedule outpatient follow-up for approximately 2 weeks.

## 2017-04-10 ENCOUNTER — Telehealth: Payer: Self-pay | Admitting: Oncology

## 2017-04-10 NOTE — Progress Notes (Signed)
Consult PROGRESS NOTE    Jasmine Cuevas  YWV:[id number] DOB: 1946-01-26 DOA: 03/31/2017 PCP: System, Pcp Not In   Brief Narrative: 71 year old female with history of GERD, chronic anemia presented with nausea vomiting and positive stool Hemoccult. Found to have hemoglobin of 5.8 on admission. Colonoscopy with ulcerated friable proximal ascending colon mass and CT scan showed loculated cystic mass arising from the undersurface of left lobe of the liver. Patient underwent hemicolectomy and consistent with colonic adenocarcinoma.  Assessment & Plan:   #Malignant neoplasm of ascending colon and large cystic and a plasma in right upper quadrant: -s/p exploratory laparotomy, right colectomy, removal of large cystic neoplasm 10/29 -Adenocarcinoma of the colon, recommended outpatient follow-up with oncology. -Patient has a drain  - Tolerating clear liquid diet, plan to advance diet by tomorrow. Patient denied nausea vomiting. She is passing gas. -Protonix changed to oral. -Repeat labs tomorrow.  #Normocytic anemia: Hemoglobin is stable. No sign of bleeding. The patient received red blood cell transfusion during surgery.  #Chronic kidney disease stage III: Serum creatinine level is stable. Monitor BMP. Avoid nephrotoxins.  #Nausea vomiting and abdominal pain: Improved.  #Urinary tract infection lower: Received ceftriaxone.  #Incidental finding of a 3 mm right lower lobe lung node: Recommended outpatient follow-up.  DVT prophylaxis: Lovenox subcutaneous Code Status: Full code Family Communication: Discussed with the patient's husband at bedside Disposition Plan: Currently admitted  Subjective: Denied nausea, vomiting, abdominal pain. No chest pain or shortness of breath. Tolerated diet well. Passing gas.  Objective: Vitals:   04/09/17 1217 04/09/17 1348 04/09/17 2110 04/10/17 0653  BP: (!) 184/85 (!) 164/88 (!) 158/93 (!) 144/78  Pulse:  92 93 72  Resp:  19 20 16   Temp:  97.6 F  (36.4 C) 98.9 F (37.2 C) 98.2 F (36.8 C)  TempSrc:  Oral Oral Oral  SpO2:  98% 98% 95%  Weight:      Height:        Intake/Output Summary (Last 24 hours) at 04/10/17 1204 Last data filed at 04/10/17 1000  Gross per 24 hour  Intake             1305 ml  Output             4355 ml  Net            -3050 ml   Filed Weights   04/04/17 0537 04/05/17 0449 04/06/17 0456  Weight: 83 kg (183 lb) 83.4 kg (183 lb 13.8 oz) 81.6 kg (179 lb 12.8 oz)    Examination:  General exam: Lying on bed, not in distress  Respiratory system: Clear bilaterally, respiratory effort normal, no wheezing or crackle Cardiovascular system: Regular rate rhythm, S1-S2 normal. No pedal edema Gastrointestinal system: Surgical site was dressing, bowel sound positive. Central nervous system: Alert and oriented. No focal neurological deficits. Skin: No rashes, lesions or ulcers Psychiatry: Judgement and insight appear normal. Mood & affect appropriate.     Data Reviewed: I have personally reviewed following labs and imaging studies  CBC:  Recent Labs Lab 04/06/17 0553 04/06/17 2150 04/07/17 0350 04/08/17 0040 04/09/17 0451  WBC 4.8 9.7 9.5 11.0* 9.5  HGB 8.9* 10.2* 10.4* 9.3* 9.2*  HCT 28.1* 31.4* 32.0* 28.3* 29.5*  MCV 78.7 80.7 80.6 81.1 82.6  PLT 243 202 216 204 854   Basic Metabolic Panel:  Recent Labs Lab 04/06/17 0553 04/07/17 0350 04/07/17 0820 04/08/17 0040 04/09/17 0451  NA 140 146* 146* 147* 145  K 4.1 4.2 4.0 3.9 3.7  CL 109 117* 119* 121* 119*  CO2 22 20* 19* 20* 19*  GLUCOSE 95 211* 184* 166* 122*  BUN 9 16 15 13 9   CREATININE 1.91* 2.14* 2.19* 2.10* 1.96*  CALCIUM 8.8* 8.4* 8.3* 8.5* 8.5*   GFR: Estimated Creatinine Clearance: 27.8 mL/min (A) (by C-G formula based on SCr of 1.96 mg/dL (H)). Liver Function Tests: No results for input(s): AST, ALT, ALKPHOS, BILITOT, PROT, ALBUMIN in the last 168 hours. No results for input(s): LIPASE, AMYLASE in the last 168 hours. No  results for input(s): AMMONIA in the last 168 hours. Coagulation Profile:  Recent Labs Lab 04/06/17 0553  INR 1.15   Cardiac Enzymes: No results for input(s): CKTOTAL, CKMB, CKMBINDEX, TROPONINI in the last 168 hours. BNP (last 3 results) No results for input(s): PROBNP in the last 8760 hours. HbA1C: No results for input(s): HGBA1C in the last 72 hours. CBG: No results for input(s): GLUCAP in the last 168 hours. Lipid Profile: No results for input(s): CHOL, HDL, LDLCALC, TRIG, CHOLHDL, LDLDIRECT in the last 72 hours. Thyroid Function Tests: No results for input(s): TSH, T4TOTAL, FREET4, T3FREE, THYROIDAB in the last 72 hours. Anemia Panel: No results for input(s): VITAMINB12, FOLATE, FERRITIN, TIBC, IRON, RETICCTPCT in the last 72 hours. Sepsis Labs: No results for input(s): PROCALCITON, LATICACIDVEN in the last 168 hours.  Recent Results (from the past 240 hour(s))  Urine culture     Status: Abnormal   Collection Time: 03/31/17  9:07 PM  Result Value Ref Range Status   Specimen Description URINE, RANDOM  Final   Special Requests NONE  Final   Culture >=100,000 COLONIES/mL KLEBSIELLA PNEUMONIAE (A)  Final   Report Status 04/03/2017 FINAL  Final   Organism ID, Bacteria KLEBSIELLA PNEUMONIAE (A)  Final      Susceptibility   Klebsiella pneumoniae - MIC*    AMPICILLIN RESISTANT Resistant     CEFAZOLIN <=4 SENSITIVE Sensitive     CEFTRIAXONE <=1 SENSITIVE Sensitive     CIPROFLOXACIN <=0.25 SENSITIVE Sensitive     GENTAMICIN <=1 SENSITIVE Sensitive     IMIPENEM 0.5 SENSITIVE Sensitive     NITROFURANTOIN <=16 SENSITIVE Sensitive     TRIMETH/SULFA <=20 SENSITIVE Sensitive     AMPICILLIN/SULBACTAM <=2 SENSITIVE Sensitive     PIP/TAZO <=4 SENSITIVE Sensitive     Extended ESBL NEGATIVE Sensitive     * >=100,000 COLONIES/mL KLEBSIELLA PNEUMONIAE  MRSA PCR Screening     Status: None   Collection Time: 03/31/17 10:14 PM  Result Value Ref Range Status   MRSA by PCR NEGATIVE  NEGATIVE Final    Comment:        The GeneXpert MRSA Assay (FDA approved for NASAL specimens only), is one component of a comprehensive MRSA colonization surveillance program. It is not intended to diagnose MRSA infection nor to guide or monitor treatment for MRSA infections.   Gastrointestinal Panel by PCR , Stool     Status: None   Collection Time: 04/01/17  1:45 PM  Result Value Ref Range Status   Campylobacter species NOT DETECTED NOT DETECTED Final   Plesimonas shigelloides NOT DETECTED NOT DETECTED Final   Salmonella species NOT DETECTED NOT DETECTED Final   Yersinia enterocolitica NOT DETECTED NOT DETECTED Final   Vibrio species NOT DETECTED NOT DETECTED Final   Vibrio cholerae NOT DETECTED NOT DETECTED Final   Enteroaggregative E coli (EAEC) NOT DETECTED NOT DETECTED Final   Enteropathogenic E coli (EPEC) NOT DETECTED NOT DETECTED Final   Enterotoxigenic E coli (ETEC) NOT  DETECTED NOT DETECTED Final   Shiga like toxin producing E coli (STEC) NOT DETECTED NOT DETECTED Final   Shigella/Enteroinvasive E coli (EIEC) NOT DETECTED NOT DETECTED Final   Cryptosporidium NOT DETECTED NOT DETECTED Final   Cyclospora cayetanensis NOT DETECTED NOT DETECTED Final   Entamoeba histolytica NOT DETECTED NOT DETECTED Final   Giardia lamblia NOT DETECTED NOT DETECTED Final   Adenovirus F40/41 NOT DETECTED NOT DETECTED Final   Astrovirus NOT DETECTED NOT DETECTED Final   Norovirus GI/GII NOT DETECTED NOT DETECTED Final   Rotavirus A NOT DETECTED NOT DETECTED Final   Sapovirus (I, II, IV, and V) NOT DETECTED NOT DETECTED Final  C difficile quick scan w PCR reflex     Status: None   Collection Time: 04/01/17  1:45 PM  Result Value Ref Range Status   C Diff antigen NEGATIVE NEGATIVE Final   C Diff toxin NEGATIVE NEGATIVE Final   C Diff interpretation No C. difficile detected.  Final  Surgical pcr screen     Status: None   Collection Time: 04/03/17  4:43 PM  Result Value Ref Range Status    MRSA, PCR NEGATIVE NEGATIVE Final   Staphylococcus aureus NEGATIVE NEGATIVE Final    Comment: (NOTE) The Xpert SA Assay (FDA approved for NASAL specimens in patients 30 years of age and older), is one component of a comprehensive surveillance program. It is not intended to diagnose infection nor to guide or monitor treatment.          Radiology Studies: No results found.      Scheduled Meds: . alvimopan  12 mg Oral BID  . enoxaparin (LOVENOX) injection  30 mg Subcutaneous Q24H  . pantoprazole  40 mg Oral QHS   Continuous Infusions: . dextrose 5 % and 0.45 % NaCl with KCl 20 mEq/L 50 mL/hr at 04/10/17 0956  . methocarbamol (ROBAXIN)  IV Stopped (04/10/17 2446)     LOS: 10 days    Tanice Petre Tanna Furry, MD [facility] Pager [phone]  If 7PM-7AM, please contact night-coverage [email or web address] Password [facility] 04/10/2017, 12:04 PM

## 2017-04-10 NOTE — Progress Notes (Signed)
Pharmacy Brief Note - Alvimopan (Entereg)  The standing order set for alvimopan (Entereg) now includes an automatic order to discontinue the drug after the patient has had a bowel movement. The change was approved by the [place] and the Medical Executive Committee.  This patient has had a bowel movement documented by nursing. Therefore, alvimopan has been discontinued. If there are questions, please contact the pharmacy at [phone].  Thank you   Reuel Boom, PharmD, BCPS Pager: [phone] 04/10/2017, 2:46 PM

## 2017-04-10 NOTE — Telephone Encounter (Signed)
Patient currently in hospital. Spoke with [place] Coralyn Mark re appointment for 11/13.

## 2017-04-10 NOTE — Plan of Care (Signed)
Problem: Pain Managment: Goal: General experience of comfort will improve Outcome: Progressing Medicated once for pain with full relief  Problem: Skin Integrity: Goal: Risk for impaired skin integrity will decrease Outcome: Progressing No skin impairement noted, staples dry and intact to midline incision, no drainage or redness noted  Problem: Bowel/Gastric: Goal: Will not experience complications related to bowel motility Outcome: Not Progressing Denies passing flatus

## 2017-04-10 NOTE — Progress Notes (Signed)
Physical Therapy Treatment Patient Details Name: Jasmine Cuevas MRN: [id number] DOB: [birth year] Today's Date: 04/10/2017    History of Present Illness 71 yo female admitted with anemia, N/V. Diagnosed with colorectal cancer, liver mass. S/P ex lap, R colectomy, resection cystic neoplasm 04/06/17    PT Comments    Ambulated 400 feet without AD with min guard for safety. Required 25% VC's for sit to supine via log rolling back in bed. Educated patient on positioning in bed with pillows under knees to minimize abdominal pain due to surgery.   Follow Up Recommendations  No PT follow up     Equipment Recommendations  None recommended by PT    Recommendations for Other Services       Precautions / Restrictions Precautions Precautions: Other (comment) Precaution Comments: jp drain R abdomen. HOH. Restrictions Weight Bearing Restrictions: No    Mobility  Bed Mobility Overal bed mobility: Needs Assistance Bed Mobility: Sit to Supine;Rolling Rolling: Min assist     Sit to supine: Min assist   General bed mobility comments: 25% VC's for log rolling sit to supine. Min assist with LE's  Transfers Overall transfer level: Needs assistance   Transfers: Sit to/from Stand Sit to Stand: Min guard            Ambulation/Gait Ambulation/Gait assistance: Min guard Ambulation Distance (Feet): 400 Feet Assistive device: None Gait Pattern/deviations: Step-through pattern;Decreased stride length     General Gait Details: 400 feet without AD, min guard for safety   Stairs            Wheelchair Mobility    Modified Rankin (Stroke Patients Only)       Balance                                            Cognition Arousal/Alertness: Awake/alert Behavior During Therapy: WFL for tasks assessed/performed Overall Cognitive Status: Within Functional Limits for tasks assessed                                        Exercises       General Comments        Pertinent Vitals/Pain Pain Assessment: No/denies pain    Home Living                      Prior Function            PT Goals (current goals can now be found in the care plan section)      Frequency    Min 3X/week      PT Plan Current plan remains appropriate    Co-evaluation              AM-PAC PT "6 Clicks" Daily Activity  Outcome Measure  Difficulty turning over in bed (including adjusting bedclothes, sheets and blankets)?: A Little Difficulty moving from lying on back to sitting on the side of the bed? : A Little Difficulty sitting down on and standing up from a chair with arms (e.g., wheelchair, bedside commode, etc,.)?: A Little Help needed moving to and from a bed to chair (including a wheelchair)?: A Little Help needed walking in hospital room?: A Little Help needed climbing 3-5 steps with a railing? : A Little 6 Click Score: 18  End of Session Equipment Utilized During Treatment: Gait belt Activity Tolerance: Patient tolerated treatment well;No increased pain Patient left: with call bell/phone within reach;in bed;with family/visitor present   PT Visit Diagnosis: Difficulty in walking, not elsewhere classified (R26.2)     Time: 7793-9688 PT Time Calculation (min) (ACUTE ONLY): 14 min  Charges:  $Gait Training: 8-22 mins                    G Codes:       Almond Lint, SPTA [facility] [place]  PTA [facility]  Acute  Rehab Pager      [phone]

## 2017-04-10 NOTE — Progress Notes (Signed)
Assessment Malignant neoplasm of ascending colon (HCC) and large multilocular hepatic cyst s/p exploratory laparotomy, right colectomy, removal of large cystic neoplasm 10/29, Dr. Zella Richer - Adenocarcinoma of colon T3N0 -ileus starting to resolve  Chronic anemia  CKD stage III - creatinine went back to baseline  Medicine helping with co-morbidities, appreciate their assistance  FEN: On sips of clears VTE: SCD's, lovenox   Plan:  Continue clear liquids ad lib today.  If she does well with this, advance diet tomorrow.  Check BMET and CBC tomorrow.   LOS: 10 days     4 Days Post-Op  Chief Complaint/Subjective: Passing gas and moving bowels.  Feels less bloated.  Feels better overall.  Objective: Vital signs in last 24 hours: Temp:  [97.6 F (36.4 C)-98.9 F (37.2 C)] 98.2 F (36.8 C) (11/02 0653) Pulse Rate:  [72-93] 72 (11/02 0653) Resp:  [16-20] 16 (11/02 0653) BP: (144-184)/(78-93) 144/78 (11/02 0653) SpO2:  [95 %-98 %] 95 % (11/02 0653) Last BM Date: 04/04/17  Intake/Output from previous day: 11/01 0701 - 11/02 0700 In: 71 [P.O.:520] Out: 4890 [Urine:4700; Drains:190] Intake/Output this shift: Total I/O In: 640 [P.O.:640] Out: 525 [Urine:525]  PE: General- In NAD.  Awake and alert. Abdomen-softer and a little less distended, active bowel sounds, serous drain output, wound clean and intact  Lab Results:   Recent Labs  04/08/17 0040 04/09/17 0451  WBC 11.0* 9.5  HGB 9.3* 9.2*  HCT 28.3* 29.5*  PLT 204 213   BMET  Recent Labs  04/08/17 0040 04/09/17 0451  NA 147* 145  K 3.9 3.7  CL 121* 119*  CO2 20* 19*  GLUCOSE 166* 122*  BUN 13 9  CREATININE 2.10* 1.96*  CALCIUM 8.5* 8.5*   PT/INR No results for input(s): LABPROT, INR in the last 72 hours. Comprehensive Metabolic Panel:    Component Value Date/Time   NA 145 04/09/2017 0451   NA 147 (H) 04/08/2017 0040   K 3.7 04/09/2017 0451   K 3.9 04/08/2017 0040   CL 119 (H) 04/09/2017  0451   CL 121 (H) 04/08/2017 0040   CO2 19 (L) 04/09/2017 0451   CO2 20 (L) 04/08/2017 0040   BUN 9 04/09/2017 0451   BUN 13 04/08/2017 0040   CREATININE 1.96 (H) 04/09/2017 0451   CREATININE 2.10 (H) 04/08/2017 0040   GLUCOSE 122 (H) 04/09/2017 0451   GLUCOSE 166 (H) 04/08/2017 0040   CALCIUM 8.5 (L) 04/09/2017 0451   CALCIUM 8.5 (L) 04/08/2017 0040   AST 15 04/01/2017 0856   AST 13 (L) 03/31/2017 1707   ALT 11 (L) 04/01/2017 0856   ALT 9 (L) 03/31/2017 1707   ALKPHOS 70 04/01/2017 0856   ALKPHOS 73 03/31/2017 1707   BILITOT 0.4 04/01/2017 0856   BILITOT 0.4 03/31/2017 1707   PROT 6.4 (L) 04/01/2017 0856   PROT 6.3 (L) 03/31/2017 1707   ALBUMIN 3.2 (L) 04/01/2017 0856   ALBUMIN 3.3 (L) 03/31/2017 1707     Studies/Results: No results found.  Anti-infectives: Anti-infectives    Start     Dose/Rate Route Frequency Ordered Stop   04/06/17 2200  cefoTEtan (CEFOTAN) 2 g in dextrose 5 % 50 mL IVPB     2 g 100 mL/hr over 30 Minutes Intravenous Every 12 hours 04/06/17 1736 04/06/17 2230   04/06/17 1137  cefoTEtan in Dextrose 5% (CEFOTAN) 2-2.08 GM-% IVPB    Comments:  Harvell, Gwendolyn  : cabinet override      04/06/17 1137 04/06/17 2327  04/06/17 1030  cefoTEtan (CEFOTAN) 2 g in dextrose 5 % 50 mL IVPB     2 g 100 mL/hr over 30 Minutes Intravenous  Once 04/06/17 0931 04/06/17 1327   04/05/17 1400  neomycin (MYCIFRADIN) tablet 500 mg     500 mg Oral Every 8 hours 04/05/17 1143 04/06/17 0608   04/05/17 1400  metroNIDAZOLE (FLAGYL) tablet 500 mg     500 mg Oral Every 8 hours 04/05/17 1143 04/06/17 0608   03/31/17 2000  cefTRIAXone (ROCEPHIN) 1 g in dextrose 5 % 50 mL IVPB     1 g 100 mL/hr over 30 Minutes Intravenous Every 24 hours 03/31/17 1957 04/05/17 2041       Trystin Terhune J 04/10/2017

## 2017-04-11 LAB — CBC
HCT: 27.6 % — ABNORMAL LOW (ref 36.0–46.0)
Hemoglobin: 8.6 g/dL — ABNORMAL LOW (ref 12.0–15.0)
MCH: 25.6 pg — AB (ref 26.0–34.0)
MCHC: 31.2 g/dL (ref 30.0–36.0)
MCV: 82.1 fL (ref 78.0–100.0)
PLATELETS: 210 10*3/uL (ref 150–400)
RBC: 3.36 MIL/uL — ABNORMAL LOW (ref 3.87–5.11)
RDW: 19.9 % — AB (ref 11.5–15.5)
WBC: 9.5 10*3/uL (ref 4.0–10.5)

## 2017-04-11 LAB — BASIC METABOLIC PANEL
Anion gap: 6 (ref 5–15)
BUN: 8 mg/dL (ref 6–20)
CALCIUM: 8.7 mg/dL — AB (ref 8.9–10.3)
CHLORIDE: 120 mmol/L — AB (ref 101–111)
CO2: 19 mmol/L — ABNORMAL LOW (ref 22–32)
CREATININE: 1.91 mg/dL — AB (ref 0.44–1.00)
GFR calc Af Amer: 29 mL/min — ABNORMAL LOW (ref >60)
GFR calc non Af Amer: 25 mL/min — ABNORMAL LOW (ref >60)
Glucose, Bld: 124 mg/dL — ABNORMAL HIGH (ref 65–99)
Potassium: 4.7 mmol/L (ref 3.5–5.1)
SODIUM: 145 mmol/L (ref 135–145)

## 2017-04-11 NOTE — Progress Notes (Signed)
Assessment Malignant neoplasm of ascending colon (HCC) and large multilocular hepatic cyst s/p exploratory laparotomy, right colectomy, removal of large cystic neoplasm 10/29, Dr. Zella Richer - Adenocarcinoma of colon T3N0 -ileus resolved  Chronic anemia-hemoglobin down today  CKD stage III - creatinine went back to baseline  Medicine helping with co-morbidities, appreciate their assistance  FEN: On sips of clears VTE: SCD's, lovenox   Plan:  Stop Lovenox and continue SCDs.  Check hemoglobin tomorrow.  Advance diet.   LOS: 11 days     5 Days Post-Op  Chief Complaint/Subjective: Continues passing gas and moving bowels.  Feels less bloated again today.  Feels better overall.  Objective: Vital signs in last 24 hours: Temp:  [98.5 F (36.9 C)-98.9 F (37.2 C)] 98.6 F (37 C) (11/03 0600) Pulse Rate:  [78-87] 78 (11/03 0600) Resp:  [16-18] 16 (11/03 0600) BP: (152-160)/(76-81) 152/80 (11/03 0600) SpO2:  [97 %-100 %] 97 % (11/03 0600) Last BM Date: 04/10/17  Intake/Output from previous day: 11/02 0701 - 11/03 0700 In: 2065 [P.O.:2065] Out: 1556 [Urine:1476; Drains:80] Intake/Output this shift: Total I/O In: -  Out: 15 [Drains:40]  PE: General- In NAD.  Awake and alert. Abdomen- less distended today, serous drain output, wound clean and intact  Lab Results:   Recent Labs  04/09/17 0451 04/11/17 0522  WBC 9.5 9.5  HGB 9.2* 8.6*  HCT 29.5* 27.6*  PLT 213 210   BMET  Recent Labs  04/09/17 0451 04/11/17 0522  NA 145 145  K 3.7 4.7  CL 119* 120*  CO2 19* 19*  GLUCOSE 122* 124*  BUN 9 8  CREATININE 1.96* 1.91*  CALCIUM 8.5* 8.7*   PT/INR No results for input(s): LABPROT, INR in the last 72 hours. Comprehensive Metabolic Panel:    Component Value Date/Time   NA 145 04/11/2017 0522   NA 145 04/09/2017 0451   K 4.7 04/11/2017 0522   K 3.7 04/09/2017 0451   CL 120 (H) 04/11/2017 0522   CL 119 (H) 04/09/2017 0451   CO2 19 (L) 04/11/2017 0522   CO2 19 (L) 04/09/2017 0451   BUN 8 04/11/2017 0522   BUN 9 04/09/2017 0451   CREATININE 1.91 (H) 04/11/2017 0522   CREATININE 1.96 (H) 04/09/2017 0451   GLUCOSE 124 (H) 04/11/2017 0522   GLUCOSE 122 (H) 04/09/2017 0451   CALCIUM 8.7 (L) 04/11/2017 0522   CALCIUM 8.5 (L) 04/09/2017 0451   AST 15 04/01/2017 0856   AST 13 (L) 03/31/2017 1707   ALT 11 (L) 04/01/2017 0856   ALT 9 (L) 03/31/2017 1707   ALKPHOS 70 04/01/2017 0856   ALKPHOS 73 03/31/2017 1707   BILITOT 0.4 04/01/2017 0856   BILITOT 0.4 03/31/2017 1707   PROT 6.4 (L) 04/01/2017 0856   PROT 6.3 (L) 03/31/2017 1707   ALBUMIN 3.2 (L) 04/01/2017 0856   ALBUMIN 3.3 (L) 03/31/2017 1707     Studies/Results: No results found.  Anti-infectives: Anti-infectives    Start     Dose/Rate Route Frequency Ordered Stop   04/06/17 2200  cefoTEtan (CEFOTAN) 2 g in dextrose 5 % 50 mL IVPB     2 g 100 mL/hr over 30 Minutes Intravenous Every 12 hours 04/06/17 1736 04/06/17 2230   04/06/17 1137  cefoTEtan in Dextrose 5% (CEFOTAN) 2-2.08 GM-% IVPB    Comments:  Harvell, Gwendolyn  : cabinet override      04/06/17 1137 04/06/17 2327   04/06/17 1030  cefoTEtan (CEFOTAN) 2 g in dextrose 5 % 50 mL IVPB  2 g 100 mL/hr over 30 Minutes Intravenous  Once 04/06/17 0931 04/06/17 1327   04/05/17 1400  neomycin (MYCIFRADIN) tablet 500 mg     500 mg Oral Every 8 hours 04/05/17 1143 04/06/17 0608   04/05/17 1400  metroNIDAZOLE (FLAGYL) tablet 500 mg     500 mg Oral Every 8 hours 04/05/17 1143 04/06/17 0608   03/31/17 2000  cefTRIAXone (ROCEPHIN) 1 g in dextrose 5 % 50 mL IVPB     1 g 100 mL/hr over 30 Minutes Intravenous Every 24 hours 03/31/17 1957 04/05/17 2041       Alexianna Nachreiner J 04/11/2017

## 2017-04-11 NOTE — Progress Notes (Signed)
[facility]  PROGRESS NOTE  Jasmine Cuevas GYI:[id number] DOB: 1946-06-06 DOA: 03/31/2017 PCP: System, Pcp Not In   Brief HPI:    71 year old female with history of GERD, chronic anemia presented with nausea vomiting and positive stool Hemoccult. Found to have hemoglobin of 5.8 on admission. Colonoscopy with ulcerated friable proximal ascending colon mass and CT scan showed loculated cystic mass arising from the undersurface of left lobe of the liver. Patient underwent hemicolectomy and consistent with colonic adenocarcinoma.    Subjective   Patient seen and examined, denies abdominal pain.  Passing gas and moving bowels.   Assessment/Plan:     1. Malignant neoplasm of ascending colon -patient is status post expiratory laparotomy for  large multilocular hepatic cyst-, right colectomy, removal of large cystic neoplasm.  General surgery following.  Ileus has resolved.  Patient's diet has been advanced. 2. Normocytic anemia-hemoglobin is 8.6 today.  No signs of bleeding.  Patient received red blood cell transfusion during surgery.  Follow CBC in a.m. 3. Chronic kidney disease stage III-creatinine is back to baseline.  Today creatinine is 1.91.  Follow BMP in a.m. 4. UTI-patient had Klebsiella pneumonia growing in the urine culture, completed a course of antibiotic with ceftriaxone. 5.  Lung nodule-  patient found to have 3 mm lung nodule in the right lower lobe on CT abdomen.  Recommend outpatient follow-up with noncontrast chest CT in 12 months    DVT prophylaxis: Lovenox  Code Status: Full code  Family Communication: Discussed with patient's husband at bedside  Disposition Plan: Likely home in the next 3-4 days   Consultants:  None  Procedures:  None  Continuous infusions . methocarbamol (ROBAXIN)  IV 500 mg (04/11/17 1414)      Antibiotics:   Anti-infectives    Start     Dose/Rate Route Frequency Ordered Stop   04/06/17 2200  cefoTEtan (CEFOTAN) 2 g in  dextrose 5 % 50 mL IVPB     2 g 100 mL/hr over 30 Minutes Intravenous Every 12 hours 04/06/17 1736 04/06/17 2230   04/06/17 1137  cefoTEtan in Dextrose 5% (CEFOTAN) 2-2.08 GM-% IVPB    Comments:  Harvell, Gwendolyn  : cabinet override      04/06/17 1137 04/06/17 2327   04/06/17 1030  cefoTEtan (CEFOTAN) 2 g in dextrose 5 % 50 mL IVPB     2 g 100 mL/hr over 30 Minutes Intravenous  Once 04/06/17 0931 04/06/17 1327   04/05/17 1400  neomycin (MYCIFRADIN) tablet 500 mg     500 mg Oral Every 8 hours 04/05/17 1143 04/06/17 0608   04/05/17 1400  metroNIDAZOLE (FLAGYL) tablet 500 mg     500 mg Oral Every 8 hours 04/05/17 1143 04/06/17 0608   03/31/17 2000  cefTRIAXone (ROCEPHIN) 1 g in dextrose 5 % 50 mL IVPB     1 g 100 mL/hr over 30 Minutes Intravenous Every 24 hours 03/31/17 1957 04/05/17 2041       Objective   Vitals:   04/10/17 0653 04/10/17 1410 04/10/17 2200 04/11/17 0600  BP: (!) 144/78 (!) 160/81 (!) 159/76 (!) 152/80  Pulse: 72 87 87 78  Resp: 16 16 18 16   Temp: 98.2 F (36.8 C) 98.9 F (37.2 C) 98.5 F (36.9 C) 98.6 F (37 C)  TempSrc: Oral Oral Oral Oral  SpO2: 95% 100% 100% 97%  Weight:      Height:        Intake/Output Summary (Last 24 hours) at 04/11/17 1438 Last data filed at 04/11/17  1419  Gross per 24 hour  Intake             1740 ml  Output             1983 ml  Net             -243 ml   Filed Weights   04/04/17 0537 04/05/17 0449 04/06/17 0456  Weight: 83 kg (183 lb) 83.4 kg (183 lb 13.8 oz) 81.6 kg (179 lb 12.8 oz)     Physical Examination:   Physical Exam: Eyes: No icterus, extraocular muscles intact  Mouth: Oral mucosa is moist, no lesions on palate,  Neck: Supple, no deformities, masses, or tenderness Lungs: Normal respiratory effort, bilateral clear to auscultation, no crackles or wheezes.  Heart: Regular rate and rhythm, S1 and S2 normal, no murmurs, rubs auscultated Abdomen: BS normoactive,soft,nondistended,non-tender to palpation,no  organomegaly Extremities: No pretibial edema, no erythema, no cyanosis, no clubbing Neuro : Alert and oriented to time, place and person, No focal deficits  Skin: No rashes seen on exam       Data Reviewed: I have personally reviewed following labs and imaging studies  CBG: No results for input(s): GLUCAP in the last 168 hours.  CBC:  Recent Labs Lab 04/06/17 2150 04/07/17 0350 04/08/17 0040 04/09/17 0451 04/11/17 0522  WBC 9.7 9.5 11.0* 9.5 9.5  HGB 10.2* 10.4* 9.3* 9.2* 8.6*  HCT 31.4* 32.0* 28.3* 29.5* 27.6*  MCV 80.7 80.6 81.1 82.6 82.1  PLT 202 216 204 213 622    Basic Metabolic Panel:  Recent Labs Lab 04/07/17 0350 04/07/17 0820 04/08/17 0040 04/09/17 0451 04/11/17 0522  NA 146* 146* 147* 145 145  K 4.2 4.0 3.9 3.7 4.7  CL 117* 119* 121* 119* 120*  CO2 20* 19* 20* 19* 19*  GLUCOSE 211* 184* 166* 122* 124*  BUN 16 15 13 9 8   CREATININE 2.14* 2.19* 2.10* 1.96* 1.91*  CALCIUM 8.4* 8.3* 8.5* 8.5* 8.7*    Recent Results (from the past 240 hour(s))  Surgical pcr screen     Status: None   Collection Time: 04/03/17  4:43 PM  Result Value Ref Range Status   MRSA, PCR NEGATIVE NEGATIVE Final   Staphylococcus aureus NEGATIVE NEGATIVE Final    Comment: (NOTE) The Xpert SA Assay (FDA approved for NASAL specimens in patients 53 years of age and older), is one component of a comprehensive surveillance program. It is not intended to diagnose infection nor to guide or monitor treatment.      Liver Function Tests: No results for input(s): AST, ALT, ALKPHOS, BILITOT, PROT, ALBUMIN in the last 168 hours. No results for input(s): LIPASE, AMYLASE in the last 168 hours. No results for input(s): AMMONIA in the last 168 hours.  Cardiac Enzymes: No results for input(s): CKTOTAL, CKMB, CKMBINDEX, TROPONINI in the last 168 hours. BNP (last 3 results) No results for input(s): BNP in the last 8760 hours.  ProBNP (last 3 results) No results for input(s): PROBNP in  the last 8760 hours.    Studies: No results found.  Scheduled Meds: . pantoprazole  40 mg Oral QHS      Time spent: 25 min  [facility] Pager [phone]. If 7PM-7AM, please contact night-coverage at [email or web address], Office  [phone]  password [email or web address]  04/11/2017, 2:38 PM  LOS: 11 days

## 2017-04-12 ENCOUNTER — Other Ambulatory Visit: Payer: Self-pay

## 2017-04-12 LAB — CBC
HEMATOCRIT: 28.8 % — AB (ref 36.0–46.0)
HEMOGLOBIN: 9 g/dL — AB (ref 12.0–15.0)
MCH: 25.5 pg — AB (ref 26.0–34.0)
MCHC: 31.3 g/dL (ref 30.0–36.0)
MCV: 81.6 fL (ref 78.0–100.0)
Platelets: 292 10*3/uL (ref 150–400)
RBC: 3.53 MIL/uL — ABNORMAL LOW (ref 3.87–5.11)
RDW: 20.1 % — AB (ref 11.5–15.5)
WBC: 11 10*3/uL — ABNORMAL HIGH (ref 4.0–10.5)

## 2017-04-12 LAB — BASIC METABOLIC PANEL
Anion gap: 8 (ref 5–15)
BUN: 12 mg/dL (ref 6–20)
CALCIUM: 8.9 mg/dL (ref 8.9–10.3)
CHLORIDE: 117 mmol/L — AB (ref 101–111)
CO2: 18 mmol/L — AB (ref 22–32)
CREATININE: 1.83 mg/dL — AB (ref 0.44–1.00)
GFR calc Af Amer: 31 mL/min — ABNORMAL LOW (ref >60)
GFR calc non Af Amer: 27 mL/min — ABNORMAL LOW (ref >60)
Glucose, Bld: 106 mg/dL — ABNORMAL HIGH (ref 65–99)
Potassium: 4.4 mmol/L (ref 3.5–5.1)
SODIUM: 143 mmol/L (ref 135–145)

## 2017-04-12 NOTE — Progress Notes (Signed)
Patient ID: Jasmine Cuevas, female   DOB: Apr 11, 1946, 71 y.o.   MRN: [id number] 6 Days Post-Op   Subjective: Complaining of some right-sided abdominal pain.  Having borborygmi.  Passing gas and some loose bowel movements.  No nausea but has not been taking much p.o.  Objective: Vital signs in last 24 hours: Temp:  [98.6 F (37 C)-99.6 F (37.6 C)] 98.6 F (37 C) (11/04 0532) Pulse Rate:  [84-87] 84 (11/04 0532) Resp:  [16-19] 19 (11/04 0532) BP: (127-168)/(80-97) 168/81 (11/04 0532) SpO2:  [94 %-97 %] 95 % (11/04 0532) Last BM Date: 04/11/17  Intake/Output from previous day: 11/03 0701 - 11/04 0700 In: 1600 [P.O.:1490; IV Piggyback:110] Out: 4098 [Urine:4600; Drains:82] Intake/Output this shift: No intake/output data recorded.  General appearance: alert, cooperative, no distress and Was sleeping when I entered the room GI: Mild to moderate right-sided abdominal tenderness without peritoneal signs.  JP drainage is clear serosanguineous. Incision/Wound: No erythema or drainage  Lab Results:  Recent Labs    04/11/17 0522 04/12/17 0535  WBC 9.5 11.0*  HGB 8.6* 9.0*  HCT 27.6* 28.8*  PLT 210 292   BMET Recent Labs    04/11/17 0522 04/12/17 0535  NA 145 143  K 4.7 4.4  CL 120* 117*  CO2 19* 18*  GLUCOSE 124* 106*  BUN 8 12  CREATININE 1.91* 1.83*  CALCIUM 8.7* 8.9     Studies/Results: No results found.  Anti-infectives: Anti-infectives (From admission, onward)   Start     Dose/Rate Route Frequency Ordered Stop   04/06/17 2200  cefoTEtan (CEFOTAN) 2 g in dextrose 5 % 50 mL IVPB     2 g 100 mL/hr over 30 Minutes Intravenous Every 12 hours 04/06/17 1736 04/06/17 2230   04/06/17 1137  cefoTEtan in Dextrose 5% (CEFOTAN) 2-2.08 GM-% IVPB    Comments:  Cuevas, Jasmine  : cabinet override      04/06/17 1137 04/06/17 2327   04/06/17 1030  cefoTEtan (CEFOTAN) 2 g in dextrose 5 % 50 mL IVPB     2 g 100 mL/hr over 30 Minutes Intravenous  Once 04/06/17 0931  04/06/17 1327   04/05/17 1400  neomycin (MYCIFRADIN) tablet 500 mg     500 mg Oral Every 8 hours 04/05/17 1143 04/06/17 0608   04/05/17 1400  metroNIDAZOLE (FLAGYL) tablet 500 mg     500 mg Oral Every 8 hours 04/05/17 1143 04/06/17 0608   03/31/17 2000  cefTRIAXone (ROCEPHIN) 1 g in dextrose 5 % 50 mL IVPB     1 g 100 mL/hr over 30 Minutes Intravenous Every 24 hours 03/31/17 1957 04/05/17 2041      Assessment/Plan: s/p Procedure(s): OPEN RIGHT HEMICOLECTOMY AND RESECTION OF CYSTIC ABDOMINAL MASS I think she generally is doing well.  Some slight leukocytosis and right-sided abdominal pain with a generally benign abdominal exam.  She does not appear ill.  Encouraged to try her diet and ambulate in the hall today.  Repeat CBC in a.m.   LOS: 12 days    Jasmine Cuevas T 04/12/2017

## 2017-04-12 NOTE — Progress Notes (Signed)
[facility]  PROGRESS NOTE  Jasmine Cuevas CHE:[id number] DOB: [birth year] DOA: 03/31/2017 PCP: System, Pcp Not In   Brief HPI:    71 year old female with history of GERD, chronic anemia presented with nausea vomiting and positive stool Hemoccult. Found to have hemoglobin of 5.8 on admission. Colonoscopy with ulcerated friable proximal ascending colon mass and CT scan showed loculated cystic mass arising from the undersurface of left lobe of the liver. Patient underwent hemicolectomy and consistent with colonic adenocarcinoma.    Subjective   Patient seen and examined, passing gas and having bowel movements.  Denies nausea vomiting.   Assessment/Plan:     1. Malignant neoplasm of ascending colon -patient is status post expiratory laparotomy for  large multilocular hepatic cyst-, right colectomy, removal of large cystic neoplasm.  General surgery following.  Ileus has resolved.  Patient's diet has been advanced. 2. Normocytic anemia-hemoglobin is 9.0 today.  No signs of bleeding.  Patient received red blood cell transfusion during surgery.  Follow CBC in a.m. 3. Chronic kidney disease stage III-creatinine is back to baseline.  Today creatinine is 1.83.  Follow BMP in a.m. 4. UTI-patient had Klebsiella pneumonia growing in the urine culture, completed a course of antibiotic with ceftriaxone. 5.  Lung nodule-  patient found to have 3 mm lung nodule in the right lower lobe on CT abdomen.  Recommend outpatient follow-up with noncontrast chest CT in 12 months    DVT prophylaxis: Lovenox  Code Status: Full code  Family Communication: Discussed with patient's husband at bedside  Disposition Plan: Likely home in the next 3-4 days   Consultants:  None  Procedures:  None  Continuous infusions . methocarbamol (ROBAXIN)  IV 500 mg (04/12/17 0536)      Antibiotics:   Anti-infectives (From admission, onward)   Start     Dose/Rate Route Frequency Ordered Stop   04/06/17  2200  cefoTEtan (CEFOTAN) 2 g in dextrose 5 % 50 mL IVPB     2 g 100 mL/hr over 30 Minutes Intravenous Every 12 hours 04/06/17 1736 04/06/17 2230   04/06/17 1137  cefoTEtan in Dextrose 5% (CEFOTAN) 2-2.08 GM-% IVPB    Comments:  Harvell, Gwendolyn  : cabinet override      04/06/17 1137 04/06/17 2327   04/06/17 1030  cefoTEtan (CEFOTAN) 2 g in dextrose 5 % 50 mL IVPB     2 g 100 mL/hr over 30 Minutes Intravenous  Once 04/06/17 0931 04/06/17 1327   04/05/17 1400  neomycin (MYCIFRADIN) tablet 500 mg     500 mg Oral Every 8 hours 04/05/17 1143 04/06/17 0608   04/05/17 1400  metroNIDAZOLE (FLAGYL) tablet 500 mg     500 mg Oral Every 8 hours 04/05/17 1143 04/06/17 0608   03/31/17 2000  cefTRIAXone (ROCEPHIN) 1 g in dextrose 5 % 50 mL IVPB     1 g 100 mL/hr over 30 Minutes Intravenous Every 24 hours 03/31/17 1957 04/05/17 2041       Objective   Vitals:   04/11/17 1410 04/11/17 2119 04/12/17 0532 04/12/17 1242  BP: (!) 127/97 (!) 153/80 (!) 168/81 (!) 164/72  Pulse: 84 87 84 80  Resp: 16 18 19 18   Temp:  99.6 F (37.6 C) 98.6 F (37 C) 98.9 F (37.2 C)  TempSrc: Oral Oral Oral Oral  SpO2: 97% 94% 95% 100%  Weight:      Height:        Intake/Output Summary (Last 24 hours) at 04/12/2017 1631 Last data filed at 04/12/2017  1300 Gross per 24 hour  Intake 990 ml  Output 2725 ml  Net -1735 ml   Filed Weights   04/04/17 0537 04/05/17 0449 04/06/17 0456  Weight: 83 kg (183 lb) 83.4 kg (183 lb 13.8 oz) 81.6 kg (179 lb 12.8 oz)     Physical Examination:   Physical Exam: Eyes: No icterus, extraocular muscles intact  Mouth: Oral mucosa is moist, no lesions on palate,  Neck: Supple, no deformities, masses, or tenderness Lungs: Normal respiratory effort, bilateral clear to auscultation, no crackles or wheezes.  Heart: Regular rate and rhythm, S1 and S2 normal, no murmurs, rubs auscultated Abdomen: BS normoactive,soft,nondistended,non-tender to palpation,no organomegaly Extremities:  No pretibial edema, no erythema, no cyanosis, no clubbing Neuro : Alert and oriented to time, place and person, No focal deficits  Skin: No rashes seen on exam     Data Reviewed: I have personally reviewed following labs and imaging studies  CBG: No results for input(s): GLUCAP in the last 168 hours.  CBC: Recent Labs  Lab 04/07/17 0350 04/08/17 0040 04/09/17 0451 04/11/17 0522 04/12/17 0535  WBC 9.5 11.0* 9.5 9.5 11.0*  HGB 10.4* 9.3* 9.2* 8.6* 9.0*  HCT 32.0* 28.3* 29.5* 27.6* 28.8*  MCV 80.6 81.1 82.6 82.1 81.6  PLT 216 204 213 210 427    Basic Metabolic Panel: Recent Labs  Lab 04/07/17 0820 04/08/17 0040 04/09/17 0451 04/11/17 0522 04/12/17 0535  NA 146* 147* 145 145 143  K 4.0 3.9 3.7 4.7 4.4  CL 119* 121* 119* 120* 117*  CO2 19* 20* 19* 19* 18*  GLUCOSE 184* 166* 122* 124* 106*  BUN 15 13 9 8 12   CREATININE 2.19* 2.10* 1.96* 1.91* 1.83*  CALCIUM 8.3* 8.5* 8.5* 8.7* 8.9    Recent Results (from the past 240 hour(s))  Surgical pcr screen     Status: None   Collection Time: 04/03/17  4:43 PM  Result Value Ref Range Status   MRSA, PCR NEGATIVE NEGATIVE Final   Staphylococcus aureus NEGATIVE NEGATIVE Final    Comment: (NOTE) The Xpert SA Assay (FDA approved for NASAL specimens in patients 15 years of age and older), is one component of a comprehensive surveillance program. It is not intended to diagnose infection nor to guide or monitor treatment.         Studies: No results found.  Scheduled Meds: . pantoprazole  40 mg Oral QHS      Time spent: 25 min  [facility] Pager [phone]. If 7PM-7AM, please contact night-coverage at [email or web address], Office  [phone]  password [email or web address]  04/12/2017, 4:31 PM  LOS: 12 days

## 2017-04-13 DIAGNOSIS — N183 Chronic kidney disease, stage 3 (moderate): Secondary | ICD-10-CM

## 2017-04-13 LAB — CBC
HEMATOCRIT: 28.7 % — AB (ref 36.0–46.0)
HEMOGLOBIN: 9.3 g/dL — AB (ref 12.0–15.0)
MCH: 25.9 pg — ABNORMAL LOW (ref 26.0–34.0)
MCHC: 32.4 g/dL (ref 30.0–36.0)
MCV: 79.9 fL (ref 78.0–100.0)
Platelets: 348 10*3/uL (ref 150–400)
RBC: 3.59 MIL/uL — ABNORMAL LOW (ref 3.87–5.11)
RDW: 19.5 % — ABNORMAL HIGH (ref 11.5–15.5)
WBC: 10.9 10*3/uL — AB (ref 4.0–10.5)

## 2017-04-13 MED ORDER — OXYCODONE HCL 5 MG PO TABS: 5.0000 mg | ORAL_TABLET | ORAL | 0 refills | Status: AC | PRN

## 2017-04-13 MED ORDER — METHOCARBAMOL 500 MG PO TABS: 500.0000 mg | ORAL_TABLET | Freq: Three times a day (TID) | ORAL | 0 refills | Status: AC

## 2017-04-13 MED ORDER — PANTOPRAZOLE SODIUM 40 MG PO TBEC: 40.0000 mg | DELAYED_RELEASE_TABLET | Freq: Every day | ORAL | 0 refills | Status: AC

## 2017-04-13 MED ORDER — METHOCARBAMOL 500 MG PO TABS: 500.0000 mg | ORAL_TABLET | Freq: Three times a day (TID) | ORAL | Status: DC

## 2017-04-13 MED ORDER — OXYCODONE HCL 5 MG PO TABS
5.0000 mg | ORAL_TABLET | ORAL | Status: DC | PRN
  Administered 2017-04-13: 5 mg via ORAL
  Filled 2017-04-13: qty 1

## 2017-04-13 NOTE — Progress Notes (Signed)
Discharge and medication instructions reviewed with patient and spouse. Questions answered and both deny further questions. Three prescriptions given. Neighbor is coming to drive patient and spouse home. JP drain removed and gauze dressing applied followed by ABD pad; secured with tape. Donne Hazel, RN

## 2017-04-13 NOTE — Progress Notes (Signed)
[facility]  PROGRESS NOTE  Jasmine Cuevas [id number]:[id number] DOB: 1945-09-16 DOA: 03/31/2017 PCP: System, Pcp Not In   Brief HPI:    71 year old female with history of GERD, chronic anemia presented with nausea vomiting and positive stool Hemoccult. Found to have hemoglobin of 5.8 on admission. Colonoscopy with ulcerated friable proximal ascending colon mass and CT scan showed loculated cystic mass arising from the undersurface of left lobe of the liver. Patient underwent hemicolectomy and consistent with colonic adenocarcinoma.    Subjective   Patient seen and examined, complains of abdominal cramping today.   Assessment/Plan:     1. Malignant neoplasm of ascending colon -patient is status post expiratory laparotomy for  large multilocular hepatic cyst-, right colectomy, removal of large cystic neoplasm.  General surgery following.  Ileus has resolved.  Patient's diet has been advanced.  Continue Robaxin as needed for muscle cramps. 2. Normocytic anemia-hemoglobin is 9.3 today.  No signs of bleeding.  Patient received red blood cell transfusion during surgery.   3. Chronic kidney disease stage III-creatinine is back to baseline.  Today creatinine is 1.83.  4. UTI-patient had Klebsiella pneumonia growing in the urine culture, completed a course of antibiotic with ceftriaxone. 5.  Lung nodule-  patient found to have 3 mm lung nodule in the right lower lobe on CT abdomen.  Recommend outpatient follow-up with noncontrast chest CT in 12 months    DVT prophylaxis: Lovenox  Code Status: Full code  Family Communication: Discussed with patient's husband at bedside  Disposition Plan: As per general surgery.   Consultants:  None  Procedures:  None  Continuous infusions     Antibiotics:   Anti-infectives (From admission, onward)   Start     Dose/Rate Route Frequency Ordered Stop   04/06/17 2200  cefoTEtan (CEFOTAN) 2 g in dextrose 5 % 50 mL IVPB     2 g 100 mL/hr over  30 Minutes Intravenous Every 12 hours 04/06/17 1736 04/06/17 2230   04/06/17 1137  cefoTEtan in Dextrose 5% (CEFOTAN) 2-2.08 GM-% IVPB    Comments:  Harvell, Gwendolyn  : cabinet override      04/06/17 1137 04/06/17 2327   04/06/17 1030  cefoTEtan (CEFOTAN) 2 g in dextrose 5 % 50 mL IVPB     2 g 100 mL/hr over 30 Minutes Intravenous  Once 04/06/17 0931 04/06/17 1327   04/05/17 1400  neomycin (MYCIFRADIN) tablet 500 mg     500 mg Oral Every 8 hours 04/05/17 1143 04/06/17 0608   04/05/17 1400  metroNIDAZOLE (FLAGYL) tablet 500 mg     500 mg Oral Every 8 hours 04/05/17 1143 04/06/17 0608   03/31/17 2000  cefTRIAXone (ROCEPHIN) 1 g in dextrose 5 % 50 mL IVPB     1 g 100 mL/hr over 30 Minutes Intravenous Every 24 hours 03/31/17 1957 04/05/17 2041       Objective   Vitals:   04/12/17 0532 04/12/17 1242 04/12/17 2020 04/13/17 0600  BP: (!) 168/81 (!) 164/72 (!) 167/75 (!) 158/87  Pulse: 84 80 93 88  Resp: 19 18 16 18   Temp: 98.6 F (37 C) 98.9 F (37.2 C) 99.7 F (37.6 C) 98.7 F (37.1 C)  TempSrc: Oral Oral Oral Oral  SpO2: 95% 100% 95% 96%  Weight:      Height:        Intake/Output Summary (Last 24 hours) at 04/13/2017 1259 Last data filed at 04/13/2017 0747 Gross per 24 hour  Intake 1010 ml  Output 5 ml  Net 1005 ml   Filed Weights   04/04/17 0537 04/05/17 0449 04/06/17 0456  Weight: 83 kg (183 lb) 83.4 kg (183 lb 13.8 oz) 81.6 kg (179 lb 12.8 oz)     Physical Examination:   Physical Exam: Eyes: No icterus, extraocular muscles intact  Mouth: Oral mucosa is moist, no lesions on palate,  Neck: Supple, no deformities, masses, or tenderness Lungs: Normal respiratory effort, bilateral clear to auscultation, no crackles or wheezes.  Heart: Regular rate and rhythm, S1 and S2 normal, no murmurs, rubs auscultated Abdomen: BS normoactive,soft,nondistended,non-tender to palpation,no organomegaly Extremities: No pretibial edema, no erythema, no cyanosis, no clubbing Neuro :  Alert and oriented to time, place and person, No focal deficits  Skin: No rashes seen on exam     Data Reviewed: I have personally reviewed following labs and imaging studies  CBG: No results for input(s): GLUCAP in the last 168 hours.  CBC: Recent Labs  Lab 04/08/17 0040 04/09/17 0451 04/11/17 0522 04/12/17 0535 04/13/17 0540  WBC 11.0* 9.5 9.5 11.0* 10.9*  HGB 9.3* 9.2* 8.6* 9.0* 9.3*  HCT 28.3* 29.5* 27.6* 28.8* 28.7*  MCV 81.1 82.6 82.1 81.6 79.9  PLT 204 213 210 292 818    Basic Metabolic Panel: Recent Labs  Lab 04/07/17 0820 04/08/17 0040 04/09/17 0451 04/11/17 0522 04/12/17 0535  NA 146* 147* 145 145 143  K 4.0 3.9 3.7 4.7 4.4  CL 119* 121* 119* 120* 117*  CO2 19* 20* 19* 19* 18*  GLUCOSE 184* 166* 122* 124* 106*  BUN 15 13 9 8 12   CREATININE 2.19* 2.10* 1.96* 1.91* 1.83*  CALCIUM 8.3* 8.5* 8.5* 8.7* 8.9    Recent Results (from the past 240 hour(s))  Surgical pcr screen     Status: None   Collection Time: 04/03/17  4:43 PM  Result Value Ref Range Status   MRSA, PCR NEGATIVE NEGATIVE Final   Staphylococcus aureus NEGATIVE NEGATIVE Final    Comment: (NOTE) The Xpert SA Assay (FDA approved for NASAL specimens in patients 54 years of age and older), is one component of a comprehensive surveillance program. It is not intended to diagnose infection nor to guide or monitor treatment.         Studies: No results found.  Scheduled Meds: . methocarbamol  500 mg Oral TID  . pantoprazole  40 mg Oral QHS      Time spent: 25 min  [facility] Pager [phone]. If 7PM-7AM, please contact night-coverage at [email or web address], Office  [phone]  password [email or web address]  04/13/2017, 12:59 PM  LOS: 13 days

## 2017-04-13 NOTE — Discharge Summary (Signed)
[facility]uma Discharge Summary   Patient ID: Jasmine Cuevas MRN: [id number] DOB/AGE: 71-10-1945 71 y.o.  Admit date: 03/31/2017 Discharge date: 04/13/2017  Admitting Diagnosis: Non obstructing colon mass Dizziness UTI Abdominal pain Anemia  Discharge Diagnosis Patient Active Problem List   Diagnosis Date Noted  . H/O hemicolectomy 04/06/2017  . Cancer of right colon ([place]) 04/06/2017  . CKD (chronic kidney disease), stage III ([place]) 04/05/2017  . Hearing loss 04/05/2017  . Urinary tract infection 04/05/2017  . Cough 04/05/2017  . Metabolic acidosis 17/40/8144  . GERD (gastroesophageal reflux disease) 04/05/2017  . Thyroid nodule 04/05/2017  . LVH (left ventricular hypertrophy) 04/05/2017  . Malignant neoplasm of ascending colon ([place])   . Symptomatic anemia 03/31/2017  . Dizziness 03/31/2017    Consultants Gastroenterology Oncology Internal Medicine  Imaging: No results found.  Procedures Dr. Alessandra Bevels (04/02/17) - upper endoscopy, colonoscopy Dr. Zella Richer (04/06/17) - Exploratory laparotomy, right colectomy, removal of cystic abdominal neoplasm and placement of drain.  HPI and Hospital Course:  Patient is a 71 year old female who presented to the emergency department complaining of having diarrhea 1 hour PTA on the toilet felt dizzy had stool incontinence in the shower.  Ongoing dry heaves.  Mild abdominal pain.  Patient denied hematemesis or coffee-ground emesis.  No history of constipation bright red blood in her stool or black stools.  She was in good health before Tuesday when this all occurred.  Apparently never had a sick day in her life.  Workup in the ED shows she is afebrile her vital signs are stable.  Creatinine is 1.88, glucose 181, LFTs are normal lipase is 29.  WBC was 9.3 hemoglobin 5.8 hematocrit 18.9 platelets 398,000.  TSH 1.213.  Urinalysis was normal except for many bacteria and 6-30 white cells per high-powered field.  Fecal  occult was positive for blood.  CT of the head Showed no acute intracranial abnormality.  MRI of the brain showed no acute infarct intracranial hemorrhage midline shift or extra-axial fluid collection.  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  CT of the abdomen showed a suspicious mass involving the ascending colon at the level of the ileocecal valve, worrisome for a nonobstructing colonic neoplasm.  There was a large complex multiloculated cystic mass that arises from the undersurface of the left lobe of the liver measures 9.5 x 12 x 12.5 cm. Primary differential included that of biliary cysts adenoma or adenocarcinoma.  3 mm right lower lobe nodule identified.  She was admitted by medicine, and seen by gastroenterology Dr. Arta Silence.  She was transfused and then scheduled for endoscopy and colonoscopy today.  She is found to have non-thrombosed hemorrhoids on perianal exam.  There is a significant looping of the colon. 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  Endoscopy was normal with some granular mucosa in the pre-pyloric region of the stomach, some gastritis. Pt required blood transfusion on 10/27. Pt had a right colectomy and removal of abdominal cyst on 10/29. Pt tolerated the procedure well. Pt required PRBC's during surgery. Pt's Hg remained stable for remainder of hospital stay. Oncology was involved in the pt's care, discussed pathology with pt, and recommended outpt f/u in 2 weeks after discharge. Pt had a mild ileus post-op. Diet was advanced as tolerated. Drain was removed prior to discharge.   On 04/13/17, the patient was voiding well, tolerating diet, ambulating well, pain well controlled,  vital signs stable, incisions c/d/i and felt stable for discharge home.  Patient will follow up in our office in 2 weeks and knows to call with questions or concerns.  She will call to  confirm appointment date/time.    Patient was discharged in good condition.  The [place] Substance controlled database was reviewed prior to prescribing narcotic pain medication to this patient.  Physical Exam: Gen: Alert, NAD, pleasant, cooperative Card: RRR, no M/G/R heard Pulm: CTA, no W/R/R, effort normal Abd: Soft, mild distention, +BS, incision C/D/I, drain with serosanguinous drainage, generalized TTP without guarding Skin: no rashes noted, warm and dry   Allergies as of 04/13/2017      Reactions   Aspirin    Eye swelling    Codeine    Pt states "long time ago"   Nsaids    Renal disease   Percodan [oxycodone-aspirin]    Pt states "long time ago"      Medication List    TAKE these medications   methocarbamol 500 MG tablet Commonly known as:  ROBAXIN Take 1 tablet (500 mg total) 3 (three) times daily by mouth.   oxyCODONE 5 MG immediate release tablet Commonly known as:  Oxy IR/ROXICODONE Take 1 tablet (5 mg total) every 4 (four) hours as needed by mouth for moderate pain.   pantoprazole 40 MG tablet Commonly known as:  PROTONIX Take 1 tablet (40 mg total) at bedtime by mouth.        Follow-up Information    [facility] [facility]. Call.   Contact information: [place] [address] [place]. Call.   Contact information: [place] [phone] [place] [phone]       Call [facility].   Specialty:  Family Medicine Contact information: [place] [address] [phone]       Ladell Pier, MD Follow up in 1 day(s).   Specialty:  Oncology Why:  Arrive at [employer] First floor [place] information: [place] [address] [phone]        Jackolyn Confer, MD. Go on 04/22/2017.   Specialty:  General  Surgery Why:  at 10:45AM for your follow up appointment. Please arrive 15 minutes prior to complete paperwork Contact information: [address] [place] [address] [phone]        [facility], [place]. Go on 04/20/2017.   Specialty:  General Surgery Why:  at 10:00AM for staple removal and lab work. Please arrive at 9:30AM to complete paperwork Contact information: [address] [place] [phone]          Signed: [facility] 04/13/2017, 9:47 AM Pager: [phone] Consults: [phone] Mon-Fri 7:00 am-4:30 pm Sat-Sun 7:00 am-11:30 am

## 2017-04-13 NOTE — Discharge Instructions (Signed)
[facility], [place] [phone]  OPEN ABDOMINAL SURGERY: POST OP INSTRUCTIONS  Always review your discharge instruction sheet given to you by the facility where your surgery was performed.  IF YOU HAVE DISABILITY OR FAMILY LEAVE FORMS, YOU MUST BRING THEM TO THE OFFICE FOR PROCESSING.  PLEASE DO NOT GIVE THEM TO YOUR DOCTOR.  1. A prescription for pain medication may be given to you upon discharge.  Take your pain medication as prescribed, if needed.  If narcotic pain medicine is not needed, then you may take acetaminophen (Tylenol) or ibuprofen (Advil) as needed. 2. Take your usually prescribed medications unless otherwise directed. 3. If you need a refill on your pain medication, please contact your pharmacy. They will contact our office to request authorization.  Prescriptions will not be filled after 5pm or on week-ends. 4. You should follow a light diet the first few days after arrival home, such as soup and crackers, pudding, etc.unless your doctor has advised otherwise. A high-fiber, low fat diet can be resumed as tolerated.   Be sure to include lots of fluids daily. Most patients will experience some swelling and bruising on the chest and neck area.  Ice packs will help.  Swelling and bruising can take several days to resolve 5. Most patients will experience some swelling and bruising in the area of the incision. Ice pack will help. Swelling and bruising can take several days to resolve..  6. It is common to experience some constipation if taking pain medication after surgery.  Increasing fluid intake and taking a stool softener will usually help or prevent this problem from occurring.  A mild laxative (Milk of Magnesia or Miralax) should be taken according to package directions if there are no bowel movements after 48 hours. 7.  You may have steri-strips (small skin tapes) in place directly over the incision.  These strips should be left on the skin for 7-10 days.  If your  surgeon used skin glue on the incision, you may shower in 24 hours.  The glue will flake off over the next 2-3 weeks.  Any sutures or staples will be removed at the office during your follow-up visit. You may find that a light gauze bandage over your incision may keep your staples from being rubbed or pulled. You may shower and replace the bandage daily. 8. ACTIVITIES:  You may resume regular (light) daily activities beginning the next day--such as daily self-care, walking, climbing stairs--gradually increasing activities as tolerated.  You may have sexual intercourse when it is comfortable.  Refrain from any heavy lifting or straining until approved by your doctor. Nothing >20lbs for 6-8 weeks a. You may drive when you no longer are taking prescription pain medication, you can comfortably wear a seatbelt, and you can safely maneuver your car and apply brakes 9. You should see your doctor in the office for a follow-up appointment approximately two weeks after your surgery.  Make sure that you call for this appointment within a day or two after you arrive home to insure a convenient appointment time. OTHER INSTRUCTIONS:  You need to obtain a Primary care doctor and follow up with them to have a repeat chest CT in 12 months. There was a nodule seen on your CT scan. Radiology is recommending repeat CT in 12 months. You also need a primary care provider to follow you for your chronic kidney disease.   When you come for your staple removal, you will have blood  work drawn.   Please call Dr. Gearldine Shown office to see when your follow up appointment is with oncology.   WHEN TO CALL YOUR DOCTOR: 1. Fever over 101.0 2. Inability to urinate 3. Nausea and/or vomiting 4. Extreme swelling or bruising 5. Continued bleeding from incision. 6. Increased pain, redness, or drainage from the incision. 7. Difficulty swallowing or breathing 8. Muscle cramping or spasms. 9. Numbness or tingling in hands or feet or around  lips.  The clinic staff is available to answer your questions during regular business hours.  Please dont hesitate to call and ask to speak to one of the nurses if you have concerns.  For further questions, please visit [email or web address]   Soft-Food Meal Plan Follow for 5 days after discharge A soft-food meal plan includes foods that are safe and easy to swallow. This meal plan typically is used:  If you are having trouble chewing or swallowing foods.  As a transition meal plan after only having had liquid meals for a long period.  What do I need to know about the soft-food meal plan? A soft-food meal plan includes tender foods that are soft and easy to chew and swallow. In most cases, bite-sized pieces of food are easier to swallow. A bite-sized piece is about  inch or smaller. Foods in this plan do not need to be ground or pureed. Foods that are very hard, crunchy, or sticky should be avoided. Also, breads, cereals, yogurts, and desserts with nuts, seeds, or fruits should be avoided. What foods can I eat? Grains Rice and wild rice. Moist bread, dressing, pasta, and noodles. Well-moistened dry or cooked cereals, such as farina (cooked wheat cereal), oatmeal, or grits. Biscuits, breads, muffins, pancakes, and waffles that have been well moistened. Vegetables Shredded lettuce. Cooked, tender vegetables, including potatoes without skins. Vegetable juices. Broths or creamed soups made with vegetables that are not stringy or chewy. Strained tomatoes (without seeds). Fruits Canned or well-cooked fruits. Soft (ripe), peeled fresh fruits, such as peaches, nectarines, kiwi, cantaloupe, honeydew melon, and watermelon (without seeds). Soft berries with small seeds, such as strawberries. Fruit juices (without pulp). Meats and Other Protein Sources Moist, tender, lean beef. Mutton. Lamb. Veal. Chicken. [place]. Liver. Ham. Fish without bones. Eggs. Dairy Milk, milk drinks, and cream.  Plain cream cheese and cottage cheese. Plain yogurt. Sweets/Desserts Flavored gelatin desserts. Custard. Plain ice cream, frozen yogurt, sherbet, milk shakes, and malts. Plain cakes and cookies. Plain hard candy. Other Butter, margarine (without trans fat), and cooking oils. Mayonnaise. Cream sauces. Mild spices, salt, and sugar. Syrup, molasses, honey, and jelly. The items listed above may not be a complete list of recommended foods or beverages. Contact your dietitian for more options. What foods are not recommended? Grains Dry bread, toast, crackers that have not been moistened. Coarse or dry cereals, such as bran, granola, and shredded wheat. Tough or chewy crusty breads, such as [place] bread or baguettes. Vegetables Corn. Raw vegetables except shredded lettuce. Cooked vegetables that are tough or stringy. Tough, crisp, fried potatoes and potato skins. Fruits Fresh fruits with skins or seeds or both, such as apples, pears, or grapes. Stringy, high-pulp fruits, such as papaya, pineapple, coconut, or mango. Fruit leather, fruit roll-ups, and all dried fruits. Meats and Other Protein Sources Sausages and hot dogs. Meats with gristle. Fish with bones. Nuts, seeds, and chunky peanut or other nut butters. Sweets/Desserts Cakes or cookies that are very dry or chewy. The items listed above may not be a complete list of  foods and beverages to avoid. Contact your dietitian for more information. This information is not intended to replace advice given to you by your health care provider. Make sure you discuss any questions you have with your health care provider. Document Released: 09/02/2007 Document Revised: 11/01/2015 Document Reviewed: 04/22/2013 Elsevier Interactive Patient Education  2017 [employer].

## 2017-04-21 ENCOUNTER — Ambulatory Visit: Payer: Medicare Other | Admitting: Nurse Practitioner

## 2017-04-28 NOTE — Progress Notes (Signed)
  Oncology Nurse Navigator Documentation  Navigator Location: [facility] (04/28/17 1428) Referral date to RadOnc/MedOnc: 04/10/17 (04/28/17 1428) )Navigator Encounter Type: Introductory phone call;Telephone (04/28/17 1428) Telephone: [facility] Call;Appt Confirmation/Clarification (04/28/17 1428) Abnormal Finding Date: 04/01/17 (04/28/17 1428) Confirmed Diagnosis Date: 04/06/17 (04/28/17 1428) Surgery Date: 04/06/17 (04/28/17 1428)           Treatment Initiated Date: 04/06/17 (04/28/17 1428)     Barriers/Navigation Needs: Coordination of Care(Patient was No Show on 04/21/17) (04/28/17 1428)   Interventions: Other(Called and left VM requesting that pt. call [facility] to reschedu) (04/28/17 1428)  Per Ned Card NP request I called and left VM requesting that patient call [facility] back to reschedule her missed appointment on 04/21/17.          Acuity: Level 2 (04/28/17 1428)   Acuity Level 2: Initial guidance, education and coordination as needed;Ongoing guidance and education throughout treatment as needed;Assistance expediting appointments (04/28/17 1428)     Time Spent with Patient: 15 (04/28/17 1428)

## 2017-05-01 ENCOUNTER — Telehealth: Payer: Self-pay

## 2017-05-01 NOTE — Telephone Encounter (Signed)
Attempted to call patient to see if she would like to reschedule her missed appointment from 04/21/17. I also called the husband's listed number and left a VM asking if patient can call to discuss her missed appointment. I left mu direct desk phone number for a return call.

## 2017-06-12 ENCOUNTER — Telehealth: Payer: Self-pay

## 2017-06-12 NOTE — Telephone Encounter (Signed)
Attempted to call patient to remind her of appointment on 06/15/17 @ 2:15. Phone number is not a working number. I also called the phone number listed for her husband and it was a wrong number.

## 2017-06-12 NOTE — Telephone Encounter (Signed)
Error

## 2017-06-12 NOTE — Telephone Encounter (Signed)
Called new phone number that [facility] had listed in their records. Patient answered the phone and gave the phone to her husband. Mr. Lubrano said that patient will not be coming to [facility] for any appointments because "she doesn't need it. She feels fine." I encouarged patient to call [facility] back if she changes her mind.

## 2017-06-15 ENCOUNTER — Ambulatory Visit: Payer: Medicare Other | Admitting: Nurse Practitioner

## 2018-08-17 ENCOUNTER — Emergency Department (HOSPITAL_COMMUNITY)
Admission: EM | Admit: 2018-08-17 | Discharge: 2018-08-17 | Disposition: A | Discharge status: Home / Self Care | Payer: Self-pay | Attending: Emergency Medicine | Admitting: Emergency Medicine

## 2018-08-17 ENCOUNTER — Other Ambulatory Visit: Payer: Self-pay

## 2018-08-17 ENCOUNTER — Emergency Department (HOSPITAL_COMMUNITY): Payer: Self-pay

## 2018-08-17 ENCOUNTER — Encounter (HOSPITAL_COMMUNITY): Payer: Self-pay

## 2018-08-17 DIAGNOSIS — Y9389 Activity, other specified: Secondary | ICD-10-CM | POA: Insufficient documentation

## 2018-08-17 DIAGNOSIS — M79606 Pain in leg, unspecified: Secondary | ICD-10-CM | POA: Insufficient documentation

## 2018-08-17 DIAGNOSIS — Y92233 Cafeteria of hospital as the place of occurrence of the external cause: Secondary | ICD-10-CM | POA: Insufficient documentation

## 2018-08-17 DIAGNOSIS — M542 Cervicalgia: Secondary | ICD-10-CM | POA: Insufficient documentation

## 2018-08-17 DIAGNOSIS — W01198A Fall on same level from slipping, tripping and stumbling with subsequent striking against other object, initial encounter: Secondary | ICD-10-CM | POA: Insufficient documentation

## 2018-08-17 DIAGNOSIS — Y999 Unspecified external cause status: Secondary | ICD-10-CM | POA: Insufficient documentation

## 2018-08-17 DIAGNOSIS — W19XXXA Unspecified fall, initial encounter: Secondary | ICD-10-CM

## 2018-08-17 DIAGNOSIS — J45909 Unspecified asthma, uncomplicated: Secondary | ICD-10-CM | POA: Insufficient documentation

## 2018-08-17 NOTE — ED Notes (Signed)
Bed: KZ60 Expected date: 08/17/18 Expected time: 7:23 AM Means of arrival: Other Comments:

## 2018-08-17 NOTE — ED Triage Notes (Signed)
Pt from [facility] downstairs, turned and fell from standing, fell to left side c/o left leg and neck pain, full range of head motion without pain, full range of motion bilateral extremities.. Denies striking head, denies LOC. No obvious injuries, Pt ambulatory upon arrival.

## 2018-08-17 NOTE — Discharge Instructions (Signed)
With your primary care provider in 2 days for continued evaluation.  Take Tylenol or Motrin as needed for pain and ice affected areas.  Return to the ED immediately for new or worsening symptoms or concerns, such as chest pain, shortness of breath, vomiting, fevers, difficulty walking, weakness or any concerns at all.

## 2018-08-17 NOTE — ED Provider Notes (Signed)
[place] DEPT Provider Note   CSN: [id number] Arrival date & time: 08/17/18  9518    History   Chief Complaint Chief Complaint  Patient presents with  . Fall  . Leg Pain  . Neck Pain    HPI Jasmine Cuevas is a 73 y.o. female.     HPI   73 year old female resents status post fall.  Patient states she was in the cafeteria downstairs when she turned around too quickly and it caused her to fall.  She notes she landed on her left side.  She denies hitting her head, LOC.  She states pain of her left side but states that the majority of her pain is in her neck.  She notes she feels "bruised" in her left side.  She denies any difficulty with walking, moving upper or lower extremities.  He denies any nausea, vomiting.  Past Medical History:  Diagnosis Date  . Asthma   . GERD (gastroesophageal reflux disease)     Patient Active Problem List   Diagnosis Date Noted  . H/O hemicolectomy 04/06/2017  . Cancer of right colon ([place]) 04/06/2017  . CKD (chronic kidney disease), stage III ([place]) 04/05/2017  . Hearing loss 04/05/2017  . Urinary tract infection 04/05/2017  . Cough 04/05/2017  . Metabolic acidosis 84/16/6063  . GERD (gastroesophageal reflux disease) 04/05/2017  . Thyroid nodule 04/05/2017  . LVH (left ventricular hypertrophy) 04/05/2017  . Malignant neoplasm of ascending colon ([place])   . Symptomatic anemia 03/31/2017  . Dizziness 03/31/2017    Past Surgical History:  Procedure Laterality Date  . COLONOSCOPY WITH PROPOFOL Left 04/02/2017   Procedure: COLONOSCOPY WITH PROPOFOL;  Surgeon: Otis Brace, MD;  Location: [facility];  Service: Gastroenterology;  Laterality: Left;  . ESOPHAGOGASTRODUODENOSCOPY (EGD) WITH PROPOFOL Left 04/02/2017   Procedure: ESOPHAGOGASTRODUODENOSCOPY (EGD) WITH PROPOFOL;  Surgeon: Otis Brace, MD;  Location: [facility];  Service: Gastroenterology;  Laterality: Left;  . PARTIAL COLECTOMY N/A  04/06/2017   Procedure: OPEN RIGHT HEMICOLECTOMY AND RESECTION OF CYSTIC ABDOMINAL MASS;  Surgeon: Jackolyn Confer, MD;  Location: [facility];  Service: General;  Laterality: N/A;     OB History   No obstetric history on file.      Home Medications    Prior to Admission medications   Medication Sig Start Date End Date Taking? Authorizing Provider  methocarbamol (ROBAXIN) 500 MG tablet Take 1 tablet (500 mg total) 3 (three) times daily by mouth. Patient not taking: Reported on 08/17/2018 04/13/17   Jackson Latino L, PA  oxyCODONE (OXY IR/ROXICODONE) 5 MG immediate release tablet Take 1 tablet (5 mg total) every 4 (four) hours as needed by mouth for moderate pain. Patient not taking: Reported on 08/17/2018 04/13/17   Kalman Drape, PA  pantoprazole (PROTONIX) 40 MG tablet Take 1 tablet (40 mg total) at bedtime by mouth. Patient not taking: Reported on 08/17/2018 04/13/17   Kalman Drape, PA    Family History Family History  Problem Relation Age of Onset  . Brain cancer Mother   . Heart attack Father     Social History Social History   Tobacco Use  . Smoking status: Never Smoker  . Smokeless tobacco: Never Used  Substance Use Topics  . Alcohol use: No  . Drug use: No     Allergies   Aspirin; Codeine; Nsaids; and Percodan [oxycodone-aspirin]   Review of Systems Review of Systems  Constitutional: Negative for chills and fever.  Respiratory: Negative for shortness of breath.  Cardiovascular: Negative for chest pain.  Gastrointestinal: Negative for abdominal pain, nausea and vomiting.  Musculoskeletal: Positive for neck pain. Negative for back pain and joint swelling.  Skin: Negative for wound.     Physical Exam Updated Vital Signs BP (!) 151/74 (BP Location: Left Arm)   Pulse 84   Temp 98.3 F (36.8 C) (Oral)   Resp 16   Ht 5\' 5"  (1.651 m) Comment: Simultaneous filing. User may not have seen previous data.  Wt 68 kg Comment: Simultaneous filing. User may not  have seen previous data.  SpO2 100%   BMI 24.96 kg/m   Physical Exam Vitals signs and nursing note reviewed.  Constitutional:      Appearance: She is well-developed.  HENT:     Head: Normocephalic and atraumatic.  Eyes:     Conjunctiva/sclera: Conjunctivae normal.  Neck:     Musculoskeletal: Neck supple. Spinous process tenderness present. No edema, erythema or pain with movement.     Trachea: Trachea normal.  Cardiovascular:     Rate and Rhythm: Normal rate and regular rhythm.     Heart sounds: Normal heart sounds. No murmur.  Pulmonary:     Effort: Pulmonary effort is normal. No respiratory distress.     Breath sounds: Normal breath sounds. No wheezing or rales.  Abdominal:     General: Bowel sounds are normal. There is no distension.     Palpations: Abdomen is soft.     Tenderness: There is no abdominal tenderness.  Musculoskeletal: Normal range of motion.        General: No tenderness or deformity.     Comments: Full range of motion of bilateral upper and lower extremities with strength 5 out of 5.  All joints palpated with no tenderness to palpation, no palpable deformities.  She is neurovascularly intact bilateral upper and lower extremities.  Lymphadenopathy:     Cervical: No cervical adenopathy.  Skin:    General: Skin is warm and dry.     Findings: No erythema or rash.  Neurological:     Mental Status: She is alert and oriented to person, place, and time.  Psychiatric:        Behavior: Behavior normal.      ED Treatments / Results  Labs (all labs ordered are listed, but only abnormal results are displayed) Labs Reviewed - No data to display  EKG None  Radiology Ct Cervical Spine Wo Contrast  Result Date: 08/17/2018 CLINICAL DATA:  Recent fall with neck pain, initial encounter EXAM: CT CERVICAL SPINE WITHOUT CONTRAST TECHNIQUE: Multidetector CT imaging of the cervical spine was performed without intravenous contrast. Multiplanar CT image reconstructions  were also generated. COMPARISON:  None. FINDINGS: Alignment: Within normal limits. Skull base and vertebrae: 7 cervical segments are well visualized. Vertebral body height is well maintained. Mild motion artifact is noted. Disc space narrowing from C3 to C7 is noted with mild associated osteophytic change. Facet hypertrophic changes are identified. No acute fracture or acute facet abnormality is seen. Soft tissues and spinal canal: Surrounding soft tissues demonstrate bilateral hypodense lesions within the thyroid gland. The largest of these lies on the left measuring 2.2 cm. No other soft tissue abnormality is noted. Upper chest: Within normal limits. Other: None IMPRESSION: Multilevel degenerative change without acute bony abnormality. Thyroid lesions bilaterally. Nonemergent thyroid ultrasound can be performed as clinically indicated. Electronically Signed   By: Inez Catalina M.D.   On: 08/17/2018 08:38    Procedures Procedures (including critical care time)  Medications  Ordered in ED Medications - No data to display   Initial Impression / Assessment and Plan / ED Course  I have reviewed the triage vital signs and the nursing notes.  Pertinent labs & imaging results that were available during my care of the patient were reviewed by me and considered in my medical decision making (see chart for details).        She presented status post fall.  Patient on initial evaluation states "I do not think anything is broken, they made me come."  She noted that her area of worst pain was her neck.  She is tender over the spinous process of the cervical spine.  CT cervical spine shows no acute fracture or dislocation.  Patient informed of her findings on her thyroid.  All other joints palpated and nontender palpation with full range of motion.  She declined all pain medication in the ED.  She is sitting up on the edge of the bed, comfortable, no acute distress, nontoxic, non-lethargic.  She is ready and  stable for discharge.  Final Clinical Impressions(s) / ED Diagnoses   Final diagnoses:  None    ED Discharge Orders    None       Rachel Moulds 08/17/18 1735    Lacretia Leigh, MD 08/19/18 1237

## 2019-02-06 IMAGING — CR DG CHEST 2V
2 series · 2 of 2 positions shown · non-contrast
Comparison: None.

CLINICAL DATA: Acute onset of cough, chest congestion and diarrhea.

EXAM:
CHEST  2 VIEW

[w chest lat]
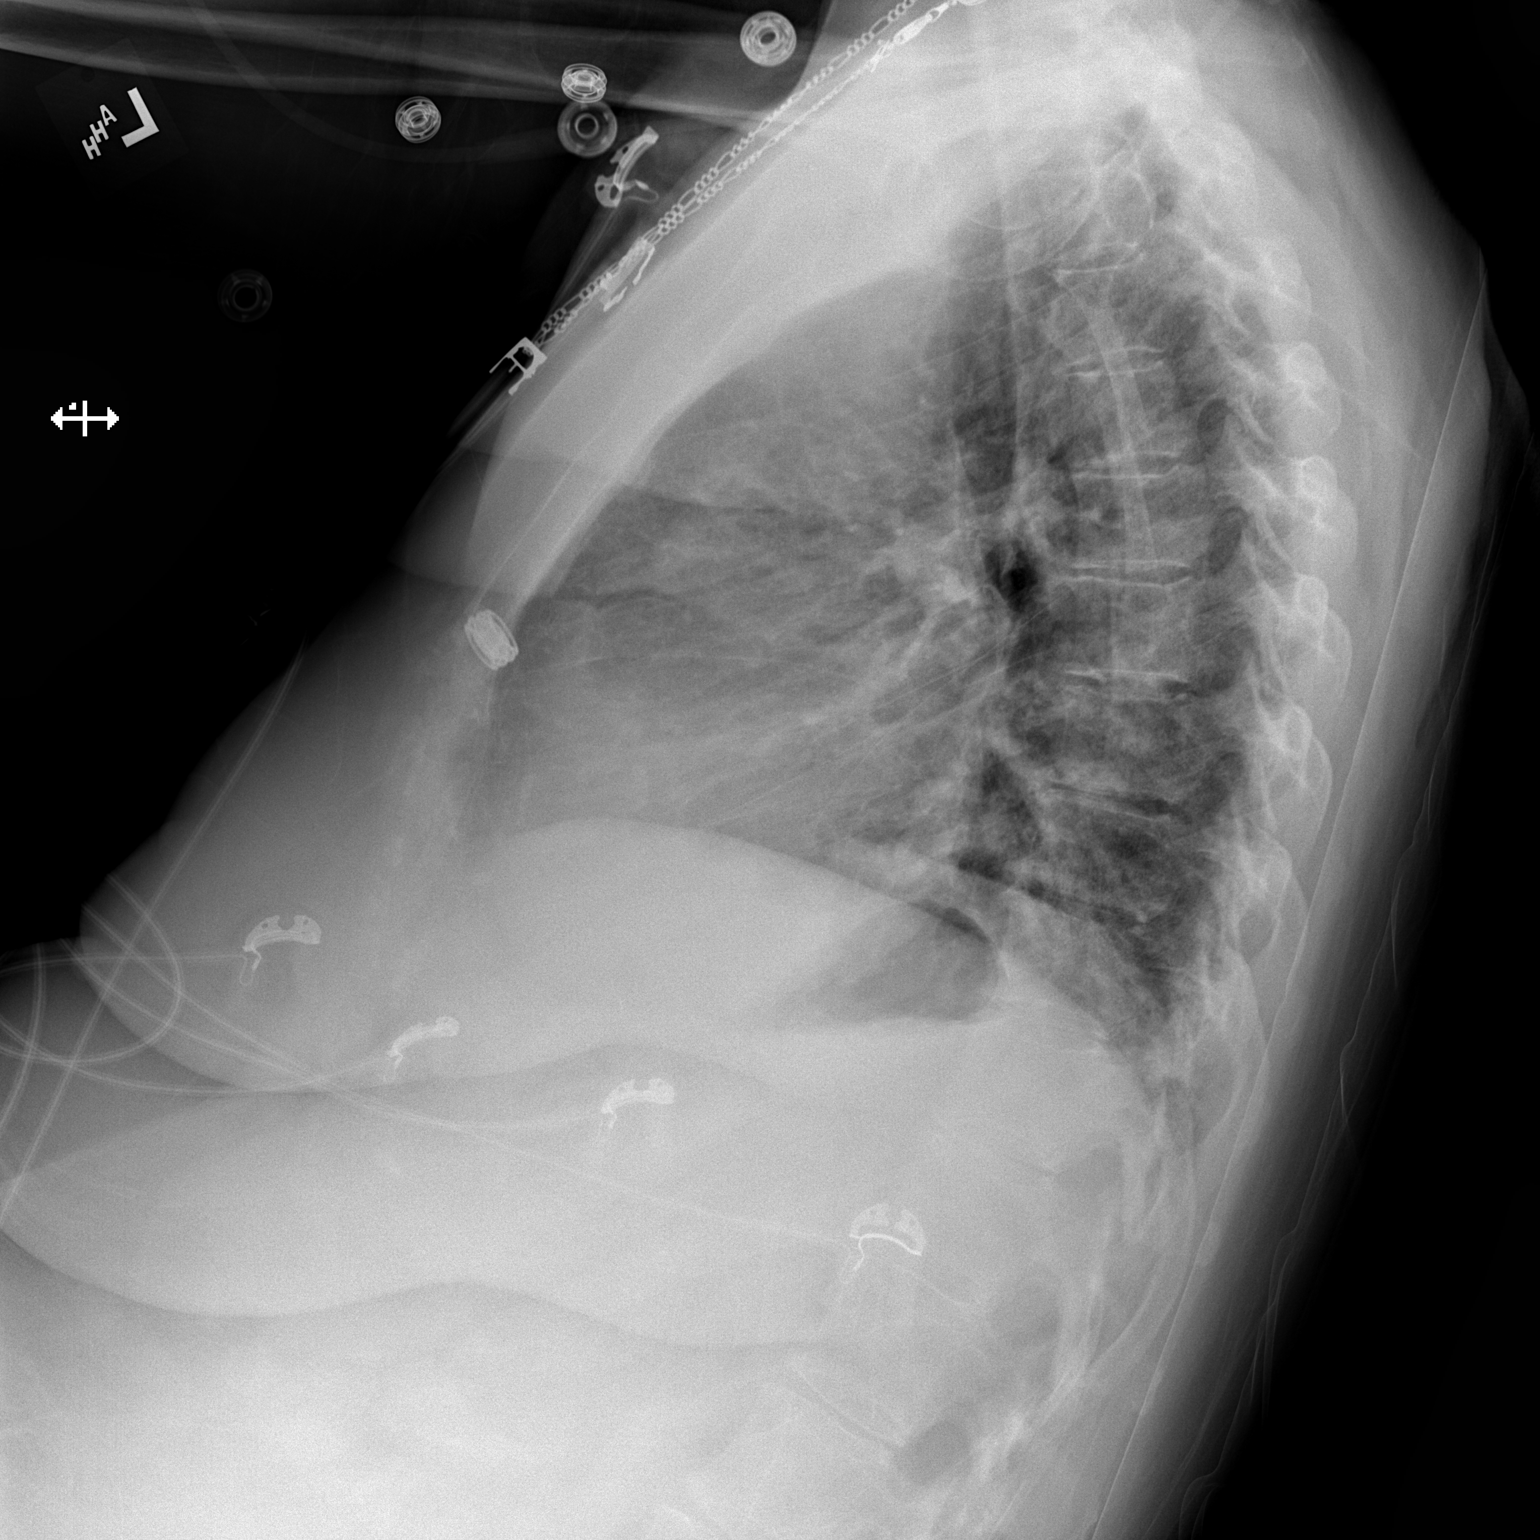

[x chest ap]
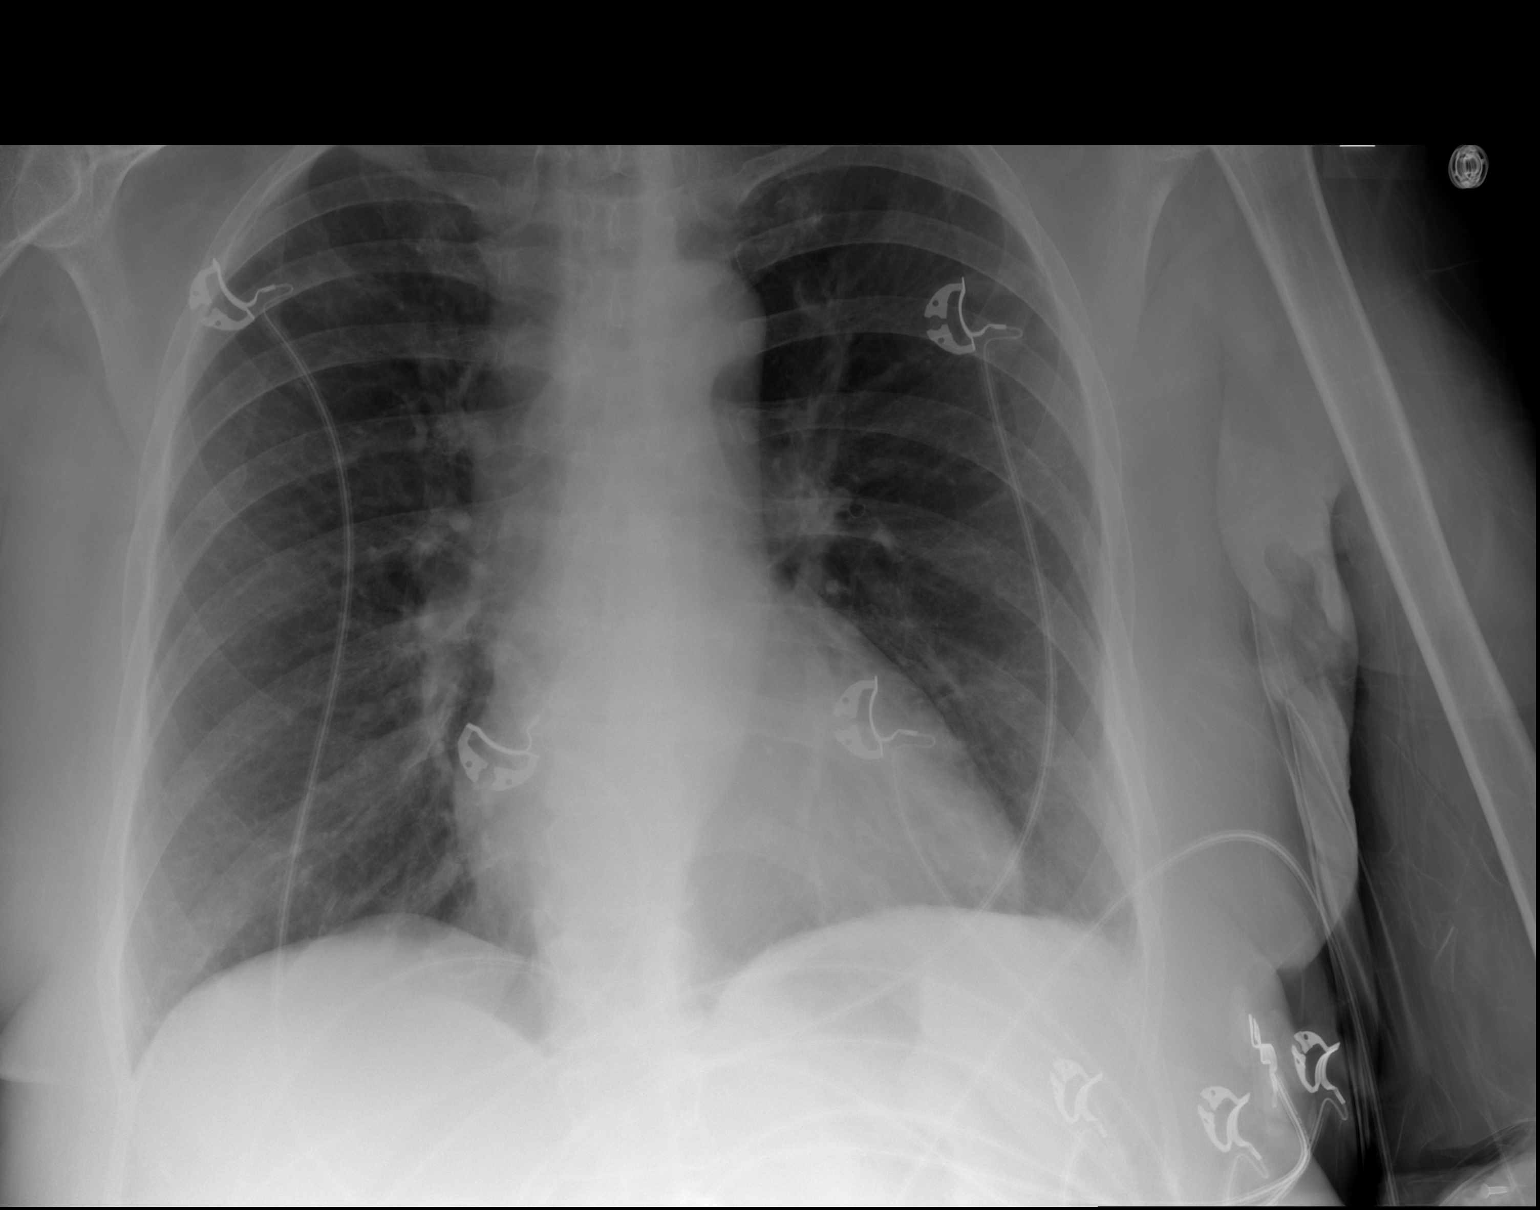

[2 of 2 positions shown; findings below may reference images not displayed]

FINDINGS: AP semi-erect and lateral images were obtained. Cardiac silhouette
normal in size for AP technique. Thoracic aorta mildly tortuous.
Hilar and mediastinal contours otherwise unremarkable. Lungs clear.
Bronchovascular markings normal. Pulmonary vascularity normal. No
visible pleural effusions. No pneumothorax. Degenerative changes
involving the thoracic and upper lumbar spine.
IMPRESSION: No acute cardiopulmonary disease.

## 2019-02-06 IMAGING — CT CT HEAD W/O CM
3 of 4 series · 15 of 47 positions shown, 18 images · non-contrast
Comparison: None.

CLINICAL DATA: 71-year-old female felt dizzy on toilet. Diarrhea.
Initial encounter.

EXAM:
CT HEAD WITHOUT CONTRAST
TECHNIQUE: Contiguous axial images were obtained from the base of the skull
through the vertex without intravenous contrast.

[Series 2: coronal · coronal · 0.27mm/px · 3 of 63 slices shown]
[im 21/63  brain]
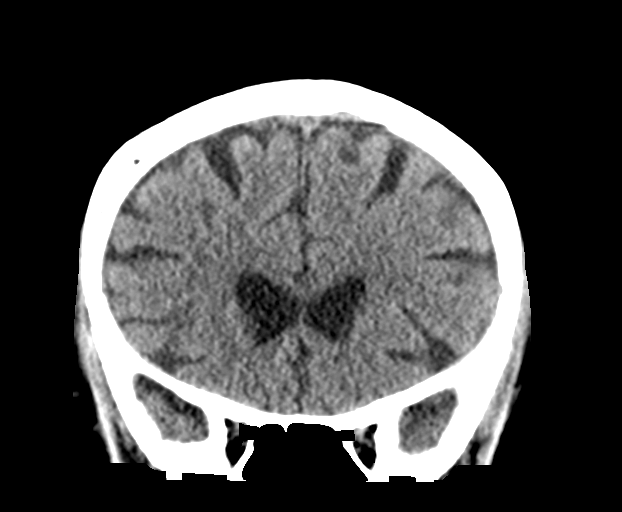
[im 28/63  brain]
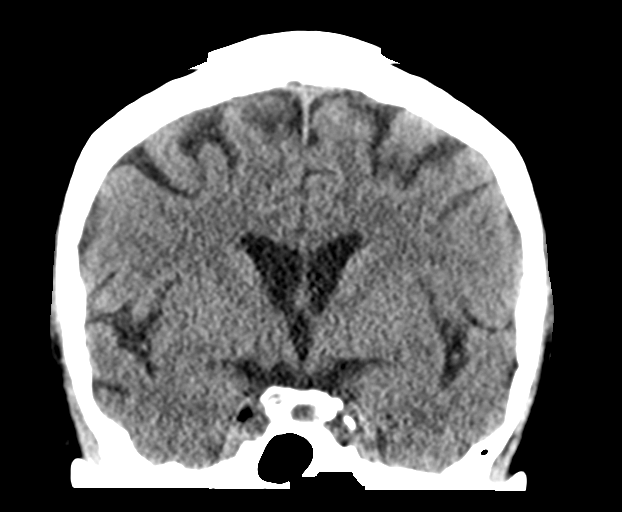
[im 35/63  brain]
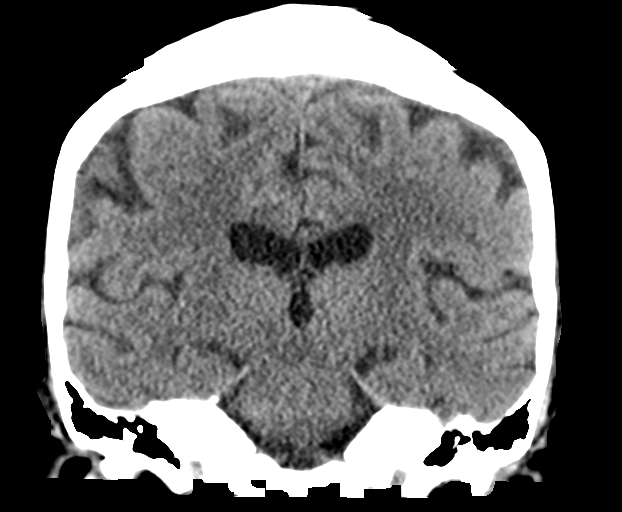

[Series 3: sagittal · sagittal · 0.31mm/px · 3 of 49 slices shown]
[im 17/49  brain]
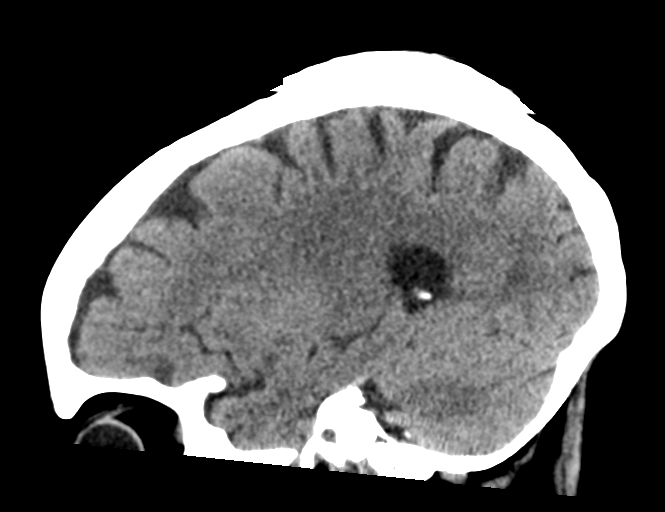
[im 25/49  brain]
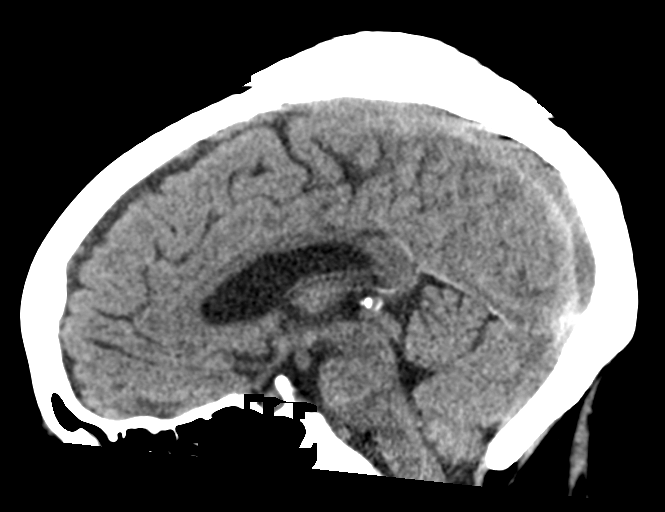
[im 33/49  brain]
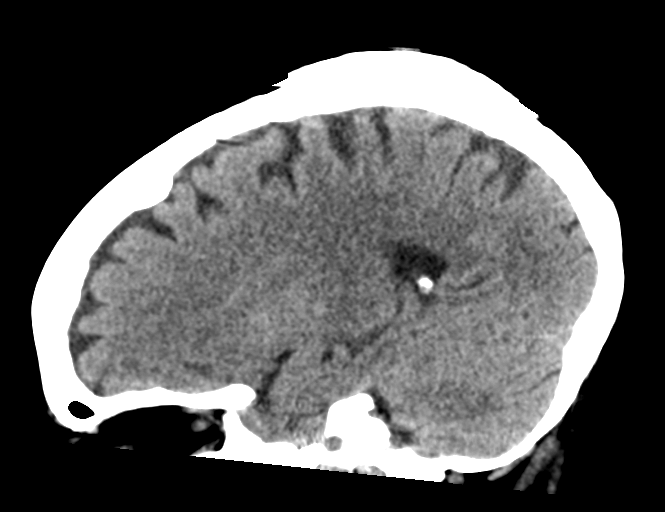

[Series 4: head w/o · axial · non-contrast · 0.45mm/px · z∈[-129,-9]mm · 9 of 28 slices shown, 12 images]
[im 2/28  brain]
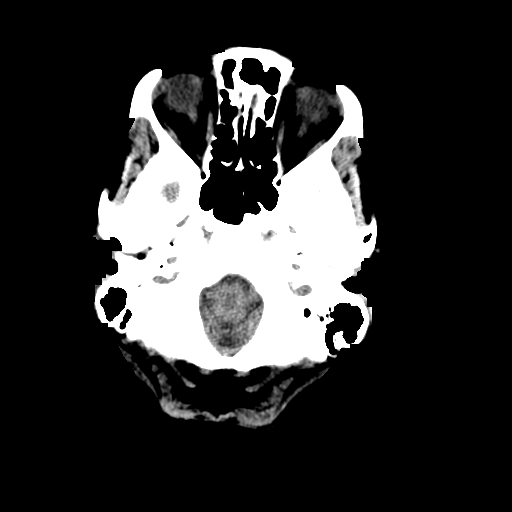
[im 2/28  bone]
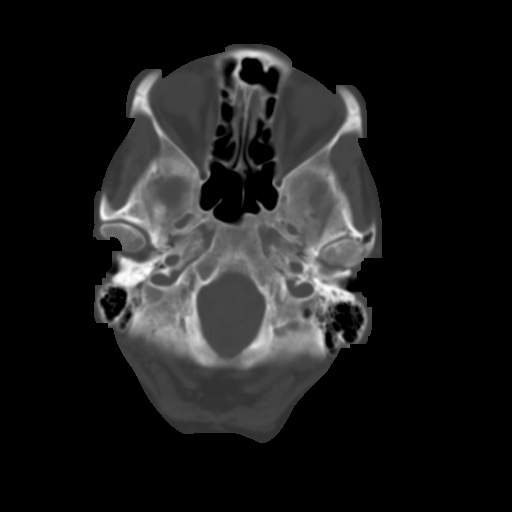
[im 6/28  brain]
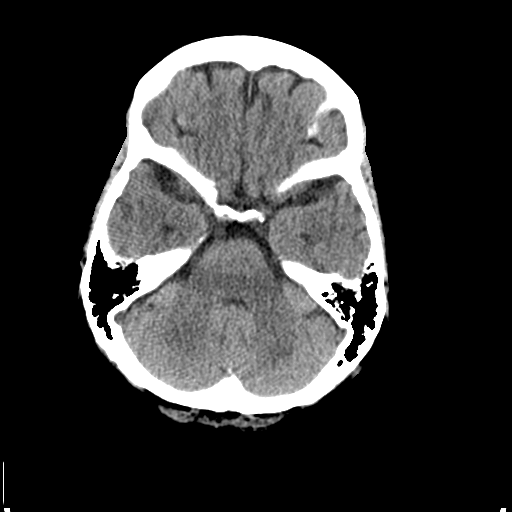
[im 8/28  brain]
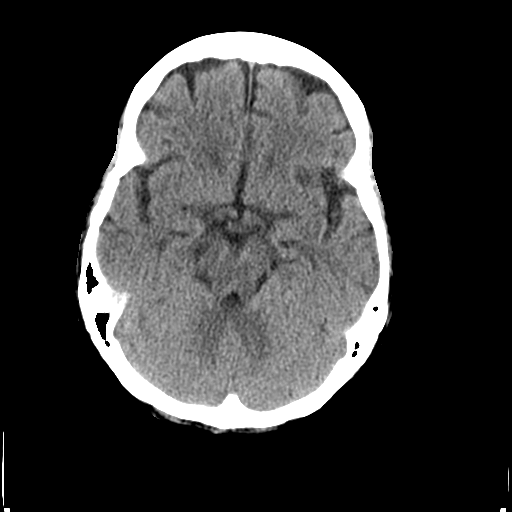
[im 12/28  brain]
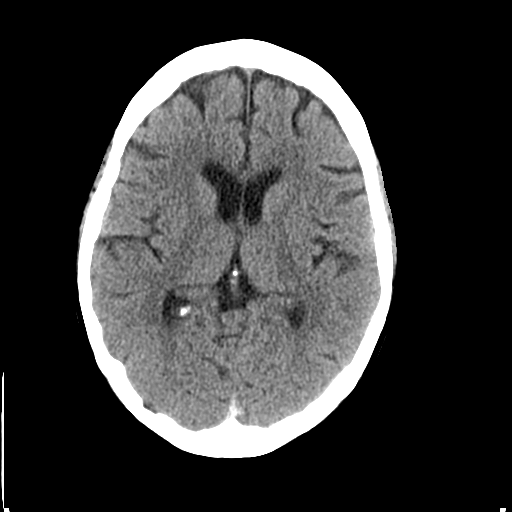
[im 14/28  brain]
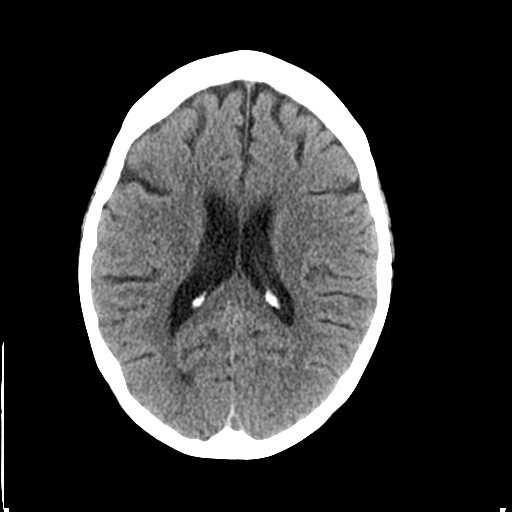
[im 14/28  bone]
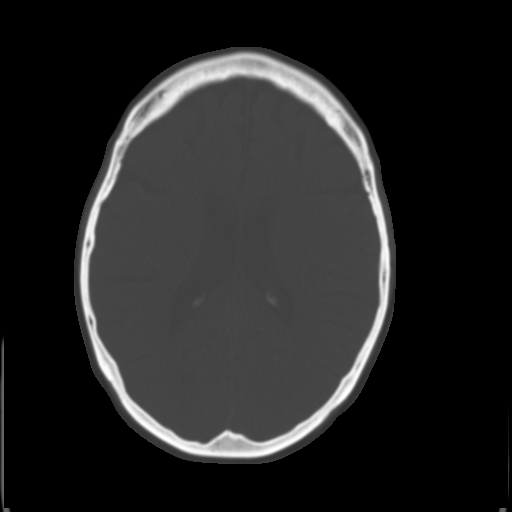
[im 16/28  brain]
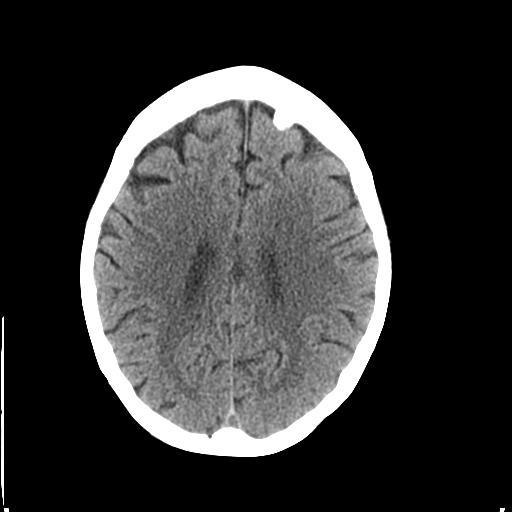
[im 20/28  brain]
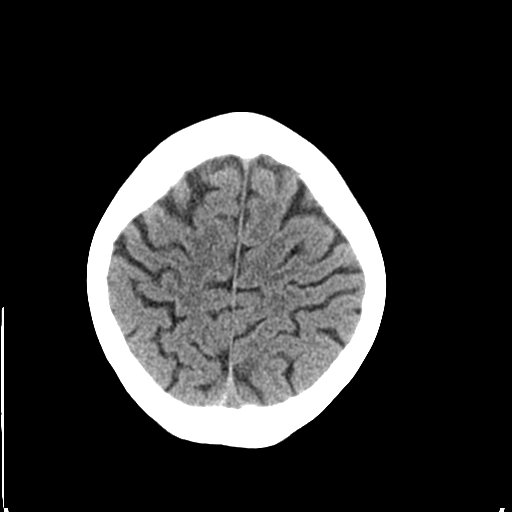
[im 22/28  brain]
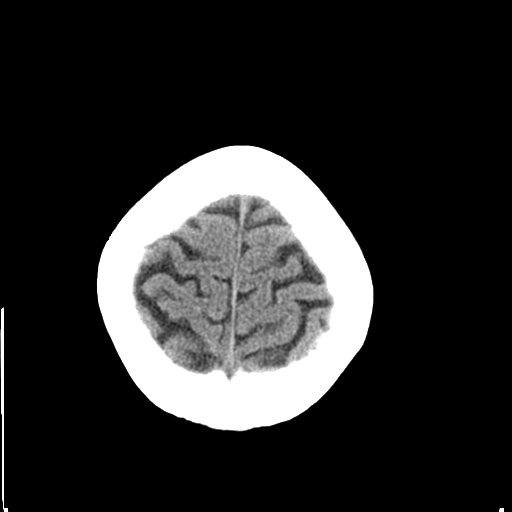
[im 26/28  brain]
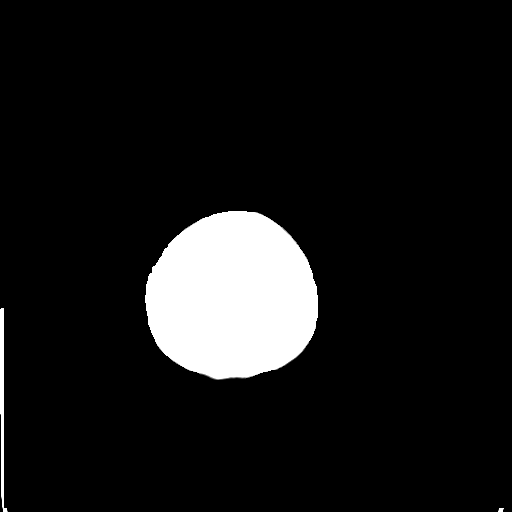
[im 26/28  bone]
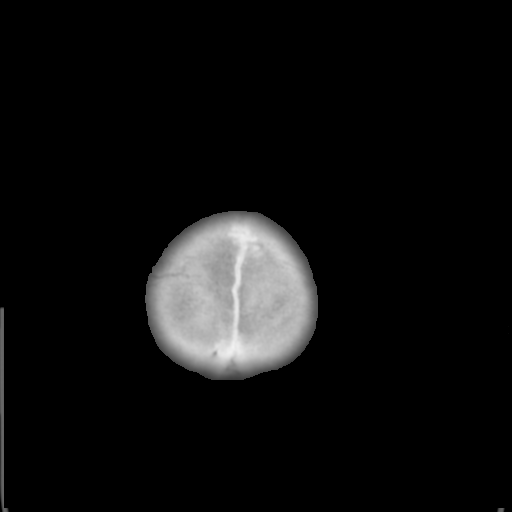

[15 of 47 positions shown; findings below may reference images not displayed]

FINDINGS: Brain: No intracranial hemorrhage or CT evidence of large acute
infarct.

Left frontal calvarium 11 mm calcified/ossified projection may
represent focal area of hyperostosis frontalis interna or calcified
meningioma. No surrounding vasogenic edema or significant mass
effect.

Vascular: Vascular calcifications

Skull: No acute abnormality.

Sinuses/Orbits: Visualized orbital structures unremarkable.
Visualized paranasal sinuses clear.

Other: Mastoid air cells and middle ear cavities clear.
IMPRESSION: No acute intracranial abnormality. Specifically, no intracranial
hemorrhage or CT evidence of large acute infarct.

Left frontal calvarium 11 mm calcified/ossified projection may
represent focal area of hyperostosis frontalis interna or calcified
meningioma. No surrounding vasogenic edema or significant mass
effect.
